# Patient Record
Sex: Male | Born: 1940 | Race: Black or African American | Hispanic: No
Health system: Southern US, Community
[De-identification: ages and names within clinical notes are randomized; demographics above are authoritative.]

## PROBLEM LIST (undated history)

## (undated) DIAGNOSIS — E785 Hyperlipidemia, unspecified: Secondary | ICD-10-CM

## (undated) DIAGNOSIS — F4310 Post-traumatic stress disorder, unspecified: Secondary | ICD-10-CM

## (undated) DIAGNOSIS — Z931 Gastrostomy status: Secondary | ICD-10-CM

## (undated) DIAGNOSIS — R2681 Unsteadiness on feet: Secondary | ICD-10-CM

## (undated) DIAGNOSIS — C61 Malignant neoplasm of prostate: Secondary | ICD-10-CM

## (undated) DIAGNOSIS — M6281 Muscle weakness (generalized): Secondary | ICD-10-CM

## (undated) DIAGNOSIS — I10 Essential (primary) hypertension: Secondary | ICD-10-CM

## (undated) DIAGNOSIS — R293 Abnormal posture: Secondary | ICD-10-CM

## (undated) DIAGNOSIS — Z9981 Dependence on supplemental oxygen: Secondary | ICD-10-CM

## (undated) DIAGNOSIS — Z66 Do not resuscitate: Secondary | ICD-10-CM

## (undated) DIAGNOSIS — G500 Trigeminal neuralgia: Secondary | ICD-10-CM

## (undated) DIAGNOSIS — F39 Unspecified mood [affective] disorder: Secondary | ICD-10-CM

## (undated) DIAGNOSIS — E119 Type 2 diabetes mellitus without complications: Secondary | ICD-10-CM

## (undated) DIAGNOSIS — M1990 Unspecified osteoarthritis, unspecified site: Secondary | ICD-10-CM

## (undated) DIAGNOSIS — K219 Gastro-esophageal reflux disease without esophagitis: Secondary | ICD-10-CM

## (undated) DIAGNOSIS — F0390 Unspecified dementia without behavioral disturbance: Secondary | ICD-10-CM

## (undated) HISTORY — PX: GASTROSTOMY: SHX151

---

## 2019-12-04 ENCOUNTER — Other Ambulatory Visit: Payer: Self-pay

## 2019-12-04 ENCOUNTER — Encounter: Payer: Self-pay | Admitting: Emergency Medicine

## 2019-12-04 ENCOUNTER — Inpatient Hospital Stay
Admission: EM | Admit: 2019-12-04 | Discharge: 2019-12-09 | DRG: 871 | Disposition: A | Discharge status: Skilled Nursing Facility | Payer: Medicare Other | Attending: Internal Medicine | Admitting: Internal Medicine

## 2019-12-04 ENCOUNTER — Emergency Department: Payer: Medicare Other

## 2019-12-04 DIAGNOSIS — F4310 Post-traumatic stress disorder, unspecified: Secondary | ICD-10-CM | POA: Diagnosis present

## 2019-12-04 DIAGNOSIS — E785 Hyperlipidemia, unspecified: Secondary | ICD-10-CM | POA: Diagnosis present

## 2019-12-04 DIAGNOSIS — R52 Pain, unspecified: Secondary | ICD-10-CM

## 2019-12-04 DIAGNOSIS — J189 Pneumonia, unspecified organism: Secondary | ICD-10-CM | POA: Diagnosis present

## 2019-12-04 DIAGNOSIS — I10 Essential (primary) hypertension: Secondary | ICD-10-CM | POA: Diagnosis present

## 2019-12-04 DIAGNOSIS — A419 Sepsis, unspecified organism: Secondary | ICD-10-CM | POA: Diagnosis not present

## 2019-12-04 DIAGNOSIS — J9601 Acute respiratory failure with hypoxia: Secondary | ICD-10-CM

## 2019-12-04 DIAGNOSIS — N390 Urinary tract infection, site not specified: Secondary | ICD-10-CM | POA: Diagnosis present

## 2019-12-04 DIAGNOSIS — Z981 Arthrodesis status: Secondary | ICD-10-CM

## 2019-12-04 DIAGNOSIS — R1310 Dysphagia, unspecified: Secondary | ICD-10-CM | POA: Diagnosis present

## 2019-12-04 DIAGNOSIS — J9600 Acute respiratory failure, unspecified whether with hypoxia or hypercapnia: Secondary | ICD-10-CM | POA: Diagnosis present

## 2019-12-04 DIAGNOSIS — G9341 Metabolic encephalopathy: Secondary | ICD-10-CM | POA: Diagnosis present

## 2019-12-04 DIAGNOSIS — R4182 Altered mental status, unspecified: Secondary | ICD-10-CM

## 2019-12-04 DIAGNOSIS — Z20822 Contact with and (suspected) exposure to covid-19: Secondary | ICD-10-CM

## 2019-12-04 DIAGNOSIS — F0390 Unspecified dementia without behavioral disturbance: Secondary | ICD-10-CM | POA: Diagnosis present

## 2019-12-04 DIAGNOSIS — Z20828 Contact with and (suspected) exposure to other viral communicable diseases: Secondary | ICD-10-CM | POA: Diagnosis present

## 2019-12-04 DIAGNOSIS — K219 Gastro-esophageal reflux disease without esophagitis: Secondary | ICD-10-CM | POA: Diagnosis present

## 2019-12-04 DIAGNOSIS — E876 Hypokalemia: Secondary | ICD-10-CM

## 2019-12-04 DIAGNOSIS — K1379 Other lesions of oral mucosa: Secondary | ICD-10-CM

## 2019-12-04 DIAGNOSIS — G9340 Encephalopathy, unspecified: Secondary | ICD-10-CM | POA: Diagnosis present

## 2019-12-04 DIAGNOSIS — Z9981 Dependence on supplemental oxygen: Secondary | ICD-10-CM

## 2019-12-04 DIAGNOSIS — Z931 Gastrostomy status: Secondary | ICD-10-CM

## 2019-12-04 DIAGNOSIS — Z66 Do not resuscitate: Secondary | ICD-10-CM

## 2019-12-04 DIAGNOSIS — J9621 Acute and chronic respiratory failure with hypoxia: Secondary | ICD-10-CM

## 2019-12-04 DIAGNOSIS — Z8546 Personal history of malignant neoplasm of prostate: Secondary | ICD-10-CM

## 2019-12-04 DIAGNOSIS — G500 Trigeminal neuralgia: Secondary | ICD-10-CM | POA: Diagnosis present

## 2019-12-04 DIAGNOSIS — Z934 Other artificial openings of gastrointestinal tract status: Secondary | ICD-10-CM

## 2019-12-04 DIAGNOSIS — R7989 Other specified abnormal findings of blood chemistry: Secondary | ICD-10-CM

## 2019-12-04 DIAGNOSIS — B961 Klebsiella pneumoniae [K. pneumoniae] as the cause of diseases classified elsewhere: Secondary | ICD-10-CM | POA: Diagnosis present

## 2019-12-04 DIAGNOSIS — E119 Type 2 diabetes mellitus without complications: Secondary | ICD-10-CM | POA: Diagnosis present

## 2019-12-04 HISTORY — DX: Unspecified osteoarthritis, unspecified site: M19.90

## 2019-12-04 HISTORY — DX: Essential (primary) hypertension: I10

## 2019-12-04 HISTORY — DX: Unsteadiness on feet: R26.81

## 2019-12-04 HISTORY — DX: Muscle weakness (generalized): M62.81

## 2019-12-04 HISTORY — DX: Abnormal posture: R29.3

## 2019-12-04 HISTORY — DX: Gastro-esophageal reflux disease without esophagitis: K21.9

## 2019-12-04 HISTORY — DX: Type 2 diabetes mellitus without complications: E11.9

## 2019-12-04 HISTORY — DX: Malignant neoplasm of prostate: C61

## 2019-12-04 HISTORY — DX: Trigeminal neuralgia: G50.0

## 2019-12-04 HISTORY — DX: Unspecified dementia, unspecified severity, without behavioral disturbance, psychotic disturbance, mood disturbance, and anxiety: F03.90

## 2019-12-04 HISTORY — DX: Dependence on supplemental oxygen: Z99.81

## 2019-12-04 HISTORY — DX: Post-traumatic stress disorder, unspecified: F43.10

## 2019-12-04 HISTORY — DX: Do not resuscitate: Z66

## 2019-12-04 HISTORY — DX: Hyperlipidemia, unspecified: E78.5

## 2019-12-04 HISTORY — DX: Gastrostomy status: Z93.1

## 2019-12-04 HISTORY — DX: Unspecified mood (affective) disorder: F39

## 2019-12-04 LAB — CBC WITH DIFFERENTIAL/PLATELET
Abs Immature Granulocytes: 0.03 10*3/uL (ref 0.00–0.07)
Basophils Absolute: 0.1 10*3/uL (ref 0.0–0.1)
Basophils Relative: 1 %
Eosinophils Absolute: 0.2 10*3/uL (ref 0.0–0.5)
Eosinophils Relative: 1 %
HCT: 36.7 % — ABNORMAL LOW (ref 39.0–52.0)
Hemoglobin: 11.4 g/dL — ABNORMAL LOW (ref 13.0–17.0)
Immature Granulocytes: 0 %
Lymphocytes Relative: 8 %
Lymphs Abs: 1 10*3/uL (ref 0.7–4.0)
MCH: 27.1 pg (ref 26.0–34.0)
MCHC: 31.1 g/dL (ref 30.0–36.0)
MCV: 87.4 fL (ref 80.0–100.0)
Monocytes Absolute: 0.9 10*3/uL (ref 0.1–1.0)
Monocytes Relative: 7 %
Neutro Abs: 10.1 10*3/uL — ABNORMAL HIGH (ref 1.7–7.7)
Neutrophils Relative %: 83 %
Platelets: 387 10*3/uL (ref 150–400)
RBC: 4.2 MIL/uL — ABNORMAL LOW (ref 4.22–5.81)
RDW: 15.3 % (ref 11.5–15.5)
WBC: 12.2 10*3/uL — ABNORMAL HIGH (ref 4.0–10.5)
nRBC: 0 % (ref 0.0–0.2)

## 2019-12-04 LAB — BLOOD GAS, ARTERIAL
Acid-Base Excess: 7.7 mmol/L — ABNORMAL HIGH (ref 0.0–2.0)
Bicarbonate: 32.7 mmol/L — ABNORMAL HIGH (ref 20.0–28.0)
FIO2: 0.28
O2 Saturation: 98.4 %
Patient temperature: 37
pCO2 arterial: 46 mmHg (ref 32.0–48.0)
pH, Arterial: 7.46 — ABNORMAL HIGH (ref 7.350–7.450)
pO2, Arterial: 106 mmHg (ref 83.0–108.0)

## 2019-12-04 NOTE — ED Triage Notes (Signed)
Pt to ED via [facility] from [facility]  for chief complaint of SHOB and decreased LOC. Per EMS pt was lethargic and drooling upon arrival. Pt was 84% on RA, 97% on NRB. Hx diabetes, CBG 143. Pt appears lethargic at this time. Responds to verbal stimuli

## 2019-12-04 NOTE — ED Provider Notes (Signed)
[facility] Emergency Department Provider Note  ____________________________________________   First MD Initiated Contact with Patient 12/04/19 2324     (approximate)  I have reviewed the triage vital signs and the nursing notes.   HISTORY  Chief Complaint Shortness of Breath  Level 5 caveat:  history/ROS limited by chronic dementia and acute respiratory illness.  HPI Chad Allen is a 78 y.o. male with medical history as listed below which I transcribed from his records from the nursing facility.  He presents by EMS for evaluation of shortness of breath and decreased level of consciousness.  Minimal details are available but apparently EMS was called out for the chief complaint as described above and they found the patient "drooling and lethargic".  He was reportedly satting 84% on room air but he does have a well-documented history of oxygen dependence at baseline on 2 L by nasal cannula.  On nonrebreather he came up to 97%.  He was still obtunded upon arrival to the ED and did not respond to verbal stimuli but responded to sternal rub by withdrawing from the painful stimulus.  However a few minutes later by the time I saw him after  he had been on 4 L of oxygen by nasal cannula and satting 100%, he was awake, somnolent but answering questions and denying pain.  He reported some shortness of breath but is unable to provide details.  He has a documented history of dementia as well as the oxygen dependence (see below for further details about his history).  Of note he is from [facility] which she has had multiple Covid patients recently but according to EMS he was tested a week ago and was negative.  He is unable to provide additional history.        Past Medical History:  Diagnosis Date  . Abnormal posture   . Dementia ([place])   . 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 DNR (do not resuscitate)   . Feeding by G-tube ([place])     . Generalized muscle weakness   . GERD (gastroesophageal reflux disease)   . Hyperlipidemia   . Hypertension   . Malignant neoplasm of prostate ([place])   . Mood disorder ([place])   . Osteoarthritis   . PTSD (post-traumatic stress disorder)   . Trigeminal neuralgia   . Type 2 diabetes mellitus without complications ([place])   . Unsteadiness on feet     Patient Active Problem List   Diagnosis Date Noted  . Acute metabolic encephalopathy [id number]  . Sepsis ([place]) 12/05/2019  . UTI (urinary tract infection) 12/05/2019  . Suspected COVID-19 virus infection 12/05/2019  . 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 Dementia without behavioral disturbance ([place]) 12/05/2019  . Acute on chronic respiratory failure with hypoxia ([place]) 12/05/2019  . Community acquired pneumonia 12/05/2019  . Essential hypertension 12/05/2019  . Acute respiratory failure ([place]) 12/05/2019    Past Surgical History:  Procedure Laterality Date  . GASTROSTOMY      Prior to Admission medications   Not on File    Allergies Patient has no allergy information on record.  History reviewed. No pertinent family history.  Social History Social History   Tobacco Use  . Smoking status: Unknown If Ever Smoked  Substance Use Topics  . Alcohol use: Not on file  . Drug use: Not on file    Review of Systems Level 5 caveat:  history/ROS limited by chronic dementia and acute respiratory  illness.  ____________________________________________   PHYSICAL EXAM:  VITAL SIGNS: ED Triage Vitals  Enc Vitals Group     BP 12/04/19 2318 131/78     Pulse Rate 12/04/19 2318 (!) 107     Resp 12/04/19 2318 17     Temp --      Temp src --      SpO2 12/04/19 2318 100 %     Weight 12/04/19 2319 81.6 kg (180 lb)     Height 12/04/19 2319 1.829 m (6')     Head Circumference --      Peak Flow --      Pain Score 12/04/19 2319 Asleep     Pain Loc --      Pain Edu? --      Excl. in [place]? --     Constitutional: Awake and alert  but somnolent. Eyes: Conjunctivae are normal.  Head: Atraumatic. Nose: No congestion/rhinnorhea. Mouth/Throat: Patient was not wearing a mask upon arrival but I placed one on him over his nasal cannula. Neck: No stridor.  No meningeal signs.   Cardiovascular: Mild tachycardia, regular rhythm. Good peripheral circulation. Grossly normal heart sounds. Respiratory: Normal respiratory effort.  No retractions.  I have put the patient back on 2 L of oxygen by nasal cannula which is his baseline. Gastrointestinal: Soft and nontender. No distention.  Musculoskeletal: No lower extremity tenderness nor edema. No gross deformities of extremities. Neurologic:  Normal speech and language, answers simple questions without difficulty.. No gross focal neurologic deficits are appreciated.  Skin:  Skin is warm, dry and intact.   ____________________________________________   LABS (all labs ordered are listed, but only abnormal results are displayed)  Labs Reviewed  CBC WITH DIFFERENTIAL/PLATELET - Abnormal; Notable for the following components:      Result Value   WBC 12.2 (*)    RBC 4.20 (*)    Hemoglobin 11.4 (*)    HCT 36.7 (*)    Neutro Abs 10.1 (*)    All other components within normal limits  COMPREHENSIVE METABOLIC PANEL - Abnormal; Notable for the following components:   Potassium 3.3 (*)    Glucose, Bld 130 (*)    Total Protein 8.5 (*)    Albumin 3.4 (*)    AST 12 (*)    Anion gap 19 (*)    All other components within normal limits  FIBRIN DERIVATIVES D-DIMER ([facility] ONLY) - Abnormal; Notable for the following components:   Fibrin derivatives D-dimer ([facility]) 1,485.13 (*)    All other components within normal limits  FIBRINOGEN - Abnormal; Notable for the following components:   Fibrinogen >750 (*)    All other components within normal limits  BRAIN NATRIURETIC PEPTIDE - Abnormal; Notable for the following components:   B Natriuretic Peptide 131.0 (*)    All other components within  normal limits  BLOOD GAS, ARTERIAL - Abnormal; Notable for the following components:   pH, Arterial 7.46 (*)    Bicarbonate 32.7 (*)    Acid-Base Excess 7.7 (*)    All other components within normal limits  URINALYSIS, ROUTINE W REFLEX MICROSCOPIC - Abnormal; Notable for the following components:   Color, Urine YELLOW (*)    APPearance CLOUDY (*)    Hgb urine dipstick SMALL (*)    Ketones, ur 5 (*)    Protein, ur 100 (*)    Leukocytes,Ua LARGE (*)    WBC, UA >50 (*)    Bacteria, UA MANY (*)    All other components within normal  limits  GLUCOSE, CAPILLARY - Abnormal; Notable for the following components:   Glucose-Capillary 101 (*)    All other components within normal limits  CULTURE, BLOOD (ROUTINE X 2)  CULTURE, BLOOD (ROUTINE X 2)  URINE CULTURE  SARS CORONAVIRUS 2 (TAT 6-24 HRS)  PROCALCITONIN  LACTATE DEHYDROGENASE  FERRITIN  TRIGLYCERIDES  LACTIC ACID, PLASMA  C-REACTIVE PROTEIN  CORTISOL-AM, BLOOD  HEMOGLOBIN A1C  POC SARS CORONAVIRUS 2 AG -  ED  TROPONIN I (HIGH SENSITIVITY)   ____________________________________________  EKG  ED ECG REPORT I, Hinda Kehr, the attending physician, personally viewed and interpreted this ECG.  Date: 12/04/2019 EKG Time: 23: 16 Rate: 109 Rhythm: Sinus tachycardia QRS Axis: normal Intervals: Prolonged QTC of 516 ms ST/T Wave abnormalities: normal Narrative Interpretation: no evidence of acute ischemia  ____________________________________________  RADIOLOGY I, Hinda Kehr, personally viewed and evaluated these images (plain radiographs) as part of my medical decision making, as well as reviewing the written report by the radiologist.  ED MD interpretation:  No obvious infection/consolidation on CXR  Official radiology report(s):  DG Chest Port 1 View  Result Date: 12/05/2019 CLINICAL DATA:  Shortness of breath. Altered level of consciousness. EXAM: PORTABLE CHEST 1 VIEW COMPARISON:  None. FINDINGS: Left lung apex  not included in the field of view. Lung volumes are low with bronchovascular crowding. Heart size grossly normal. Atelectasis with mild interstitial thickening at the bases, left greater than. No visualized pneumothorax or large pleural effusion. IMPRESSION: Low lung volumes with bronchovascular crowding. Bibasilar atelectasis with basilar interstitial thickening, chronicity uncertain. No element of basilar infection is considered, primarily on the left. Electronically Signed   By: Keith Rake M.D.   On: 12/05/2019 00:27    ____________________________________________   PROCEDURES   Procedure(s) performed (including Critical Care):  .Critical Care Performed by: Hinda Kehr, MD Authorized by: Hinda Kehr, MD   Critical care provider statement:    Critical care time (minutes):  30   Critical care time was exclusive of:  Separately billable procedures and treating other patients   Critical care was necessary to treat or prevent imminent or life-threatening deterioration of the following conditions:  Sepsis   Critical care was time spent personally by me on the following activities:  Development of treatment plan with patient or surrogate, discussions with consultants, evaluation of patient's response to treatment, examination of patient, obtaining history from patient or surrogate, ordering and performing treatments and interventions, ordering and review of laboratory studies, ordering and review of radiographic studies, pulse oximetry, re-evaluation of patient's condition and review of old charts     ____________________________________________   [place] / MDM / [place] / ED COURSE  As part of my medical decision making, I reviewed the following data within the electronic MEDICAL RECORD NUMBER Notes from prior ED visits and [place] Controlled Substance Database   Differential diagnosis includes, but is not limited to, brief hypoxic episode due to being off of his  chronic oxygen, acute infection such as healthcare associated pneumonia or COVID-19, PE, ACS, pneumothorax, CVA or intracranial hemorrhage.  The patient was initially minimally responsive but has come around and now is answering questions and speaking to me.  I am not certain of his baseline but he is oriented to self and is able to say that he feels okay except for little shortness of breath.  His vital signs are notable for some mild tachycardia and I have asked the nurses to get a rectal temperature.  He was tested for COVID-19  a week ago but there is high prevalence in that community and I think it is a likely diagnosis but it is also possible that he simply came off of his chronic oxygen and was hypoxic long enough to become altered and has improved now that he is once again on oxygen.  I have asked respiratory to get an ABG to check for hypoxia as well as for CO2 retention.  Lab work is pending including all of the standard work-up for COVID-19 and sepsis (lactic acid and procalcitonin).  I am going to give a small fluid bolus for his tachycardia.  EKG is unremarkable except for tachycardia.  Chest x-ray pending.      Clinical Course as of Dec 04 357  Tue Dec 04, 2019  2333 pCO2 arterial: 46 [CF]  Wed Dec 05, 2019  0028 pO2, Arterial: 106 [CF]  0028 Substantially elevated D-dimer which could be related to his age and chronic medical conditions, could indicate PE, or more likely is indicative of COVID-19.  We will continue to monitor.  Fibrin derivatives D-dimer [facility])(!): 1,485.13 [CF]  0029 Also suggestive of COVID-19.  Fibrinogen(!): >750 [CF]  0029 Nondiagnostic CBC with a very slight leukocytosis, stable hemoglobin.  CBC WITH DIFFERENTIAL(!) [CF]  0037 No clear evidence of acute infection although it is possible, some bronchial thickening and bibasilar changes.  DG Chest Port 1 View [CF]  [phone] Grossly positive urine, urine culture is pending, will begin treatment with ceftriaxone 1 g IV    Urinalysis, Routine w reflex microscopic(!) [CF]  0050 Anion gap(!): 19 [CF]  0107 Elevated but not strongly elevated  Procalcitonin: 0.39 [CF]  0107 Generally reassuring  Troponin I (High Sensitivity): 11 [CF]  0119 SARS Coronavirus 2 Ag: Negative [CF]  0129 Lactic Acid, Venous: 1.3 [CF]  0137 The patient meets sepsis criteria and is persistently tachycardic at greater than 100 bpm in spite of a fluid bolus and his ceftriaxone 1 g IV.  Given the strongly positive urine I have ordered code sepsis on him.  I have ordered another 1 L of fluid but I do not want to fluid overload him and he does not meet criteria for septic shock.  He he is a little bit more somnolent than he was initially but still responsive to painful stimuli and to loud noises.  I do not think he is going to be appropriate for discharge back to his facility.  However given his worsening hypoxia initially and his altered status as well as his D-dimer and his relatively decreased level of activity at baseline, I am ordering a CTA chest which will help rule out pulmonary embolism as well as interstitial lung disease, acute infiltrates, etc.  I will also consult the hospitalist for admission.   [CF]  0206 Discussed case with Dr. Damita Dunnings who will admit.   [CF]    Clinical Course User Index [CF] Hinda Kehr, MD     ____________________________________________  FINAL CLINICAL IMPRESSION(S) / ED DIAGNOSES  Final diagnoses:  Sepsis, due to unspecified organism, unspecified whether acute organ dysfunction present [facility])  Urinary tract infection without hematuria, site unspecified  Acute respiratory failure with hypoxemia ([place])  Suspected COVID-19 virus infection  Altered mental status, unspecified altered mental status type  Dementia without behavioral disturbance, unspecified dementia type ([place])  DNR (do not resuscitate)  Elevated d-dimer  Gastrostomy in place [facility])     MEDICATIONS GIVEN DURING THIS VISIT:  Medications   cefTRIAXone (ROCEPHIN) 1 g in sodium chloride 0.9 % 100  mL IVPB (0 g Intravenous Stopped 12/05/19 0134)  enoxaparin (LOVENOX) injection 40 mg (40 mg Subcutaneous Given 12/05/19 0342)  0.9 %  sodium chloride infusion ( Intravenous New Bag/Given 12/05/19 0352)  acetaminophen (TYLENOL) tablet 650 mg (has no administration in time range)    Or  acetaminophen (TYLENOL) suppository 650 mg (has no administration in time range)  insulin aspart (novoLOG) injection 0-9 Units (0 Units Subcutaneous Not Given 12/05/19 0342)  insulin aspart (novoLOG) injection 2 Units (has no administration in time range)  doxycycline (VIBRAMYCIN) 100 mg in sodium chloride 0.9 % 250 mL IVPB (100 mg Intravenous New Bag/Given 12/05/19 0347)  sodium chloride 0.9 % bolus 500 mL (0 mLs Intravenous Stopped 12/05/19 0134)  sodium chloride 0.9 % bolus 1,000 mL (0 mLs Intravenous Stopped 12/05/19 0353)  iohexol (OMNIPAQUE) 350 MG/ML injection 75 mL (75 mLs Intravenous Contrast Given 12/05/19 0251)     ED Discharge Orders    None      *Please note:  Nilan Allen was evaluated in Emergency Department on 12/05/2019 for the symptoms described in the history of present illness. He was evaluated in the context of the global COVID-19 pandemic, which necessitated consideration that the patient might be at risk for infection with the SARS-CoV-2 virus that causes COVID-19. Institutional protocols and algorithms that pertain to the evaluation of patients at risk for COVID-19 are in a state of rapid change based on information released by regulatory bodies including the CDC and federal and state organizations. These policies and algorithms were followed during the patient's care in the ED.  Some ED evaluations and interventions may be delayed as a result of limited staffing during the pandemic.*  Note:  This document was prepared using Dragon voice recognition software and may include unintentional dictation errors.   Hinda Kehr,  MD 12/05/19 [phone]

## 2019-12-05 ENCOUNTER — Emergency Department: Payer: Medicare Other

## 2019-12-05 ENCOUNTER — Inpatient Hospital Stay: Payer: Medicare Other

## 2019-12-05 ENCOUNTER — Encounter: Payer: Self-pay | Admitting: Internal Medicine

## 2019-12-05 DIAGNOSIS — J9600 Acute respiratory failure, unspecified whether with hypoxia or hypercapnia: Secondary | ICD-10-CM | POA: Diagnosis present

## 2019-12-05 DIAGNOSIS — Z931 Gastrostomy status: Secondary | ICD-10-CM

## 2019-12-05 DIAGNOSIS — G500 Trigeminal neuralgia: Secondary | ICD-10-CM | POA: Diagnosis present

## 2019-12-05 DIAGNOSIS — J9621 Acute and chronic respiratory failure with hypoxia: Secondary | ICD-10-CM

## 2019-12-05 DIAGNOSIS — Z981 Arthrodesis status: Secondary | ICD-10-CM | POA: Diagnosis not present

## 2019-12-05 DIAGNOSIS — F4310 Post-traumatic stress disorder, unspecified: Secondary | ICD-10-CM | POA: Diagnosis present

## 2019-12-05 DIAGNOSIS — F0390 Unspecified dementia without behavioral disturbance: Secondary | ICD-10-CM | POA: Diagnosis present

## 2019-12-05 DIAGNOSIS — R1310 Dysphagia, unspecified: Secondary | ICD-10-CM | POA: Diagnosis present

## 2019-12-05 DIAGNOSIS — J189 Pneumonia, unspecified organism: Secondary | ICD-10-CM | POA: Diagnosis present

## 2019-12-05 DIAGNOSIS — E119 Type 2 diabetes mellitus without complications: Secondary | ICD-10-CM | POA: Diagnosis present

## 2019-12-05 DIAGNOSIS — I10 Essential (primary) hypertension: Secondary | ICD-10-CM | POA: Diagnosis present

## 2019-12-05 DIAGNOSIS — A419 Sepsis, unspecified organism: Secondary | ICD-10-CM | POA: Diagnosis present

## 2019-12-05 DIAGNOSIS — E876 Hypokalemia: Secondary | ICD-10-CM | POA: Diagnosis not present

## 2019-12-05 DIAGNOSIS — G9341 Metabolic encephalopathy: Secondary | ICD-10-CM | POA: Diagnosis present

## 2019-12-05 DIAGNOSIS — B961 Klebsiella pneumoniae [K. pneumoniae] as the cause of diseases classified elsewhere: Secondary | ICD-10-CM | POA: Diagnosis present

## 2019-12-05 DIAGNOSIS — Z66 Do not resuscitate: Secondary | ICD-10-CM | POA: Diagnosis present

## 2019-12-05 DIAGNOSIS — K1379 Other lesions of oral mucosa: Secondary | ICD-10-CM | POA: Diagnosis not present

## 2019-12-05 DIAGNOSIS — Z8546 Personal history of malignant neoplasm of prostate: Secondary | ICD-10-CM | POA: Diagnosis not present

## 2019-12-05 DIAGNOSIS — G9340 Encephalopathy, unspecified: Secondary | ICD-10-CM | POA: Diagnosis present

## 2019-12-05 DIAGNOSIS — N390 Urinary tract infection, site not specified: Secondary | ICD-10-CM

## 2019-12-05 DIAGNOSIS — N3000 Acute cystitis without hematuria: Secondary | ICD-10-CM

## 2019-12-05 DIAGNOSIS — K219 Gastro-esophageal reflux disease without esophagitis: Secondary | ICD-10-CM | POA: Diagnosis present

## 2019-12-05 DIAGNOSIS — Z934 Other artificial openings of gastrointestinal tract status: Secondary | ICD-10-CM | POA: Diagnosis not present

## 2019-12-05 DIAGNOSIS — E785 Hyperlipidemia, unspecified: Secondary | ICD-10-CM | POA: Diagnosis present

## 2019-12-05 DIAGNOSIS — Z20828 Contact with and (suspected) exposure to other viral communicable diseases: Secondary | ICD-10-CM | POA: Diagnosis present

## 2019-12-05 DIAGNOSIS — Z20822 Contact with and (suspected) exposure to covid-19: Secondary | ICD-10-CM

## 2019-12-05 DIAGNOSIS — Z9981 Dependence on supplemental oxygen: Secondary | ICD-10-CM | POA: Diagnosis not present

## 2019-12-05 LAB — URINALYSIS, ROUTINE W REFLEX MICROSCOPIC
Bilirubin Urine: NEGATIVE
Glucose, UA: NEGATIVE mg/dL
Ketones, ur: 5 mg/dL — AB
Nitrite: NEGATIVE
Protein, ur: 100 mg/dL — AB
Specific Gravity, Urine: 1.014 (ref 1.005–1.030)
WBC, UA: >50 WBC/hpf — ABNORMAL HIGH (ref 0–5)
pH: 5 (ref 5.0–8.0)

## 2019-12-05 LAB — COMPREHENSIVE METABOLIC PANEL
ALT: 8 U/L (ref 0–44)
AST: 12 U/L — ABNORMAL LOW (ref 15–41)
Albumin: 3.4 g/dL — ABNORMAL LOW (ref 3.5–5.0)
Alkaline Phosphatase: 113 U/L (ref 38–126)
Anion gap: 19 — ABNORMAL HIGH (ref 5–15)
BUN: 18 mg/dL (ref 8–23)
CO2: 25 mmol/L (ref 22–32)
Calcium: 8.9 mg/dL (ref 8.9–10.3)
Chloride: 98 mmol/L (ref 98–111)
Creatinine, Ser: 1.12 mg/dL (ref 0.61–1.24)
GFR calc Af Amer: >60 mL/min (ref >60)
GFR calc non Af Amer: >60 mL/min (ref >60)
Glucose, Bld: 130 mg/dL — ABNORMAL HIGH (ref 70–99)
Potassium: 3.3 mmol/L — ABNORMAL LOW (ref 3.5–5.1)
Sodium: 142 mmol/L (ref 135–145)
Total Bilirubin: 0.9 mg/dL (ref 0.3–1.2)
Total Protein: 8.5 g/dL — ABNORMAL HIGH (ref 6.5–8.1)

## 2019-12-05 LAB — LACTIC ACID, PLASMA: Lactic Acid, Venous: 1.3 mmol/L (ref 0.5–1.9)

## 2019-12-05 LAB — BRAIN NATRIURETIC PEPTIDE: B Natriuretic Peptide: 131 pg/mL — ABNORMAL HIGH (ref 0.0–100.0)

## 2019-12-05 LAB — FIBRINOGEN: Fibrinogen: >750 mg/dL — ABNORMAL HIGH (ref 210–475)

## 2019-12-05 LAB — TRIGLYCERIDES: Triglycerides: 120 mg/dL (ref <150)

## 2019-12-05 LAB — SARS CORONAVIRUS 2 (TAT 6-24 HRS): SARS Coronavirus 2: NEGATIVE

## 2019-12-05 LAB — GLUCOSE, CAPILLARY
Glucose-Capillary: 101 mg/dL — ABNORMAL HIGH (ref 70–99)
Glucose-Capillary: 90 mg/dL (ref 70–99)
Glucose-Capillary: 82 mg/dL (ref 70–99)
Glucose-Capillary: 76 mg/dL (ref 70–99)

## 2019-12-05 LAB — FERRITIN: Ferritin: 82 ng/mL (ref 24–336)

## 2019-12-05 LAB — TROPONIN I (HIGH SENSITIVITY): Troponin I (High Sensitivity): 11 ng/L (ref <18)

## 2019-12-05 LAB — C-REACTIVE PROTEIN: CRP: 17.5 mg/dL — ABNORMAL HIGH (ref <1.0)

## 2019-12-05 LAB — HEMOGLOBIN A1C
Hgb A1c MFr Bld: 4.9 % (ref 4.8–5.6)
Mean Plasma Glucose: 93.93 mg/dL

## 2019-12-05 LAB — POC SARS CORONAVIRUS 2 AG -  ED: SARS Coronavirus 2 Ag: NEGATIVE

## 2019-12-05 LAB — LACTATE DEHYDROGENASE: LDH: 151 U/L (ref 98–192)

## 2019-12-05 LAB — CORTISOL-AM, BLOOD: Cortisol - AM: 19.3 ug/dL (ref 6.7–22.6)

## 2019-12-05 LAB — FIBRIN DERIVATIVES D-DIMER (ARMC ONLY): Fibrin derivatives D-dimer (ARMC): 1485.13 ng/mL (FEU) — ABNORMAL HIGH (ref 0.00–499.00)

## 2019-12-05 LAB — PROCALCITONIN: Procalcitonin: 0.39 ng/mL

## 2019-12-05 MED ORDER — SODIUM CHLORIDE 0.9 % IV SOLN: INTRAVENOUS | Status: DC

## 2019-12-05 MED ORDER — SODIUM CHLORIDE 0.9 % IV SOLN
1.0000 g | INTRAVENOUS | Status: DC
  Administered 2019-12-05 (×2): 1 g via INTRAVENOUS
  Filled 2019-12-05 (×3): qty 10

## 2019-12-05 MED ORDER — IOHEXOL 350 MG/ML SOLN
75.0000 mL | Freq: Once | INTRAVENOUS | Status: AC | PRN
  Administered 2019-12-05: 03:00:00 75 mL via INTRAVENOUS

## 2019-12-05 MED ORDER — TRAZODONE HCL 50 MG PO TABS
50.0000 mg | ORAL_TABLET | Freq: Once | ORAL | Status: AC
  Administered 2019-12-05: 50 mg via ORAL
  Filled 2019-12-05 (×2): qty 1

## 2019-12-05 MED ORDER — ENOXAPARIN SODIUM 40 MG/0.4ML ~~LOC~~ SOLN
40.0000 mg | SUBCUTANEOUS | Status: DC
  Administered 2019-12-05 – 2019-12-09 (×5): 40 mg via SUBCUTANEOUS
  Filled 2019-12-05 (×5): qty 0.4

## 2019-12-05 MED ORDER — SODIUM CHLORIDE 0.9 % IV BOLUS (SEPSIS)
1000.0000 mL | Freq: Once | INTRAVENOUS | Status: AC
  Administered 2019-12-05: 1000 mL via INTRAVENOUS

## 2019-12-05 MED ORDER — SODIUM CHLORIDE 0.9 % IV SOLN: 1.0000 g | INTRAVENOUS | Status: DC

## 2019-12-05 MED ORDER — SODIUM CHLORIDE 0.9 % IV SOLN
100.0000 mg | Freq: Two times a day (BID) | INTRAVENOUS | Status: DC
  Administered 2019-12-05 – 2019-12-06 (×3): 100 mg via INTRAVENOUS
  Filled 2019-12-05 (×4): qty 100

## 2019-12-05 MED ORDER — INSULIN ASPART 100 UNIT/ML ~~LOC~~ SOLN
0.0000 [IU] | SUBCUTANEOUS | Status: DC
  Administered 2019-12-07 – 2019-12-08 (×2): 1 [IU] via SUBCUTANEOUS
  Administered 2019-12-08: 18:00:00 2 [IU] via SUBCUTANEOUS
  Administered 2019-12-09 (×2): 1 [IU] via SUBCUTANEOUS
  Filled 2019-12-05 (×5): qty 1

## 2019-12-05 MED ORDER — SODIUM CHLORIDE 0.9 % IV SOLN: 500.0000 mg | INTRAVENOUS | Status: DC

## 2019-12-05 MED ORDER — ACETAMINOPHEN 325 MG PO TABS
650.0000 mg | ORAL_TABLET | Freq: Four times a day (QID) | ORAL | Status: DC | PRN
  Administered 2019-12-05: 650 mg via ORAL
  Filled 2019-12-05: qty 2

## 2019-12-05 MED ORDER — ACETAMINOPHEN 650 MG RE SUPP: 650.0000 mg | Freq: Four times a day (QID) | RECTAL | Status: DC | PRN

## 2019-12-05 MED ORDER — INSULIN ASPART 100 UNIT/ML ~~LOC~~ SOLN
2.0000 [IU] | SUBCUTANEOUS | Status: DC
  Administered 2019-12-08 – 2019-12-09 (×6): 2 [IU] via SUBCUTANEOUS
  Filled 2019-12-05 (×7): qty 1

## 2019-12-05 MED ORDER — SODIUM CHLORIDE 0.9 % IV BOLUS
500.0000 mL | Freq: Once | INTRAVENOUS | Status: AC
  Administered 2019-12-05: 01:00:00 500 mL via INTRAVENOUS

## 2019-12-05 NOTE — ED Notes (Signed)
Patient transported to CT 

## 2019-12-05 NOTE — Progress Notes (Signed)
CODE SEPSIS - PHARMACY COMMUNICATION  **Broad Spectrum Antibiotics should be administered within 1 hour of Sepsis diagnosis**  Time Code Sepsis Called/Page Received: 0129  Antibiotics Ordered: doxycycline, ceftriaxone  Time of 1st antibiotic administration: 0102  Additional action taken by pharmacy:   If necessary, Name of Provider/Nurse Contacted:     Tobie Lords ,PharmD Clinical Pharmacist  12/05/2019  2:52 AM

## 2019-12-05 NOTE — ED Notes (Signed)
Recollect lactic sent to lab

## 2019-12-05 NOTE — ED Notes (Signed)
Lab notified of sending white label with covid swab due to [facility] issues

## 2019-12-05 NOTE — H&P (Signed)
History and Physical    Chad Allen ENI:[id number] DOB: 02-21-1941 DOA: 12/04/2019  PCP: Alvester Morin, MD   Patient coming from: [facility]   Chief Complaint: decreased responsiveness, hypoxia  HPI: Chad Allen is a 78 y.o. male nursing home resident with a history of dementia, gastrostomy tube, on chronic home O2, non-insulin-dependent type 2 diabetes and hypertensionWho was sent to the emergency room was sent to the emergency room after he was found with decreased responsiveness, drooling on himself and appearing to be short of breath.  Arrival of EMS they recorded an O2 sat of 84% on room air that improved to 97% on nonrebreather.  By arrival in the emergency room O2 sats in the high 90s via nonrebreather.  He was afebrile but pulse was 114 and respirations 25.  After short time in the emergency room he became more responsive and was transitioned to O2 by nasal cannula at 4 L.  Most of the history is taken from the ER provider due to altered mental status.  Was transitioned to O2 at 4 L.  ABG showed a pH of 7.46, PCO2 of 46 and PO2 of 106.His blood work was significant for white cell count of 12,000.  Very minor electrolyte abnormalities with a potassium of 3.3.  Serum creatinine 1.12.  He also had elevated inflammatory markers including ferritin 82 triglycerides 120 LDH 151 and elevated D-dimer.  Procalcitonin was up at 0.37.  His urinalysis was strongly consistent with UTI.  Patient was started on sepsis protocol, receiving 1500 mL of normal saline in the ER, and hospitalist consulted for admission.  CTA chest from the emergency room to evaluate for acute PE, currently pending.  Rapid Covid test was negative.  Review of Systems: Unable to obtain due to altered mental status  Past Medical History:  Diagnosis Date  . Abnormal posture   . Dementia ([place])   . Dependence on continuous supplemental oxygen    2L O2 by [place]  . DNR (do not resuscitate)   . Feeding by  G-tube ([place])   . Generalized muscle weakness   . GERD (gastroesophageal reflux disease)   . Hyperlipidemia   . Hypertension   . Malignant neoplasm of prostate ([place])   . Mood disorder ([place])   . Osteoarthritis   . PTSD (post-traumatic stress disorder)   . Trigeminal neuralgia   . Type 2 diabetes mellitus without complications ([place])   . Unsteadiness on feet     Past Surgical History:  Procedure Laterality Date  . GASTROSTOMY       has no history on file for tobacco, alcohol, and drug.  Not on File  History reviewed. No pertinent family history.   Prior to Admission medications   Not on File    Physical Exam: Vitals:   12/05/19 0014 12/05/19 0041 12/05/19 0100 12/05/19 0130  BP:  130/72 136/76 112/68  Pulse: (!) 105 (!) 102 100 100  Resp: 20 (!) '24 18 18  '$ Temp:      TempSrc:      SpO2: 98% 98% 98% 98%  Weight:      Height:        Constitutional: NAD, calm, comfortable Vitals:   12/05/19 0014 12/05/19 0041 12/05/19 0100 12/05/19 0130  BP:  130/72 136/76 112/68  Pulse: (!) 105 (!) 102 100 100  Resp: 20 (!) '24 18 18  '$ Temp:      TempSrc:      SpO2: 98% 98% 98% 98%  Weight:  Height:       Constitutional: Patient drowsy easily arousable Eyes: PERRL, lids and conjunctivae normal ENMT: Mucous membranes are moist.  Neck: normal, supple, no masses, no thyromegaly Respiratory: clear to auscultation bilaterally, no wheezing, no crackles. Normal respiratory effort. No accessory muscle use.  Cardiovascular: Regular rate and rhythm, no murmurs / rubs / gallops. Abdomen: no tenderness, no masses palpated.  Musculoskeletal: No gross deformities. Skin: no rashes, lesions, ulcers.  Neurologic: No gross focal neurologic deficits       Labs on Admission: I have personally reviewed following labs and imaging studies  CBC: Recent Labs  Lab 12/04/19 2342  WBC 12.2*  NEUTROABS 10.1*  HGB 11.4*  HCT 36.7*  MCV 87.4  PLT 662   Basic Metabolic Panel: Recent Labs    Lab 12/04/19 2342  NA 142  K 3.3*  CL 98  CO2 25  GLUCOSE 130*  BUN 18  CREATININE 1.12  CALCIUM 8.9   GFR: Estimated Creatinine Clearance: 61.6 mL/min (by C-G formula based on SCr of 1.12 mg/dL). Liver Function Tests: Recent Labs  Lab 12/04/19 2342  AST 12*  ALT 8  ALKPHOS 113  BILITOT 0.9  PROT 8.5*  ALBUMIN 3.4*   No results for input(s): LIPASE, AMYLASE in the last 168 hours. No results for input(s): AMMONIA in the last 168 hours. Coagulation Profile: No results for input(s): INR, PROTIME in the last 168 hours. Cardiac Enzymes: No results for input(s): CKTOTAL, CKMB, CKMBINDEX, TROPONINI in the last 168 hours. BNP (last 3 results) No results for input(s): PROBNP in the last 8760 hours. HbA1C: No results for input(s): HGBA1C in the last 72 hours. CBG: No results for input(s): GLUCAP in the last 168 hours. Lipid Profile: Recent Labs    12/04/19 2336  TRIG 120   Thyroid Function Tests: No results for input(s): TSH, T4TOTAL, FREET4, T3FREE, THYROIDAB in the last 72 hours. Anemia Panel: Recent Labs    12/04/19 2342  FERRITIN 82   Urine analysis:    Component Value Date/Time   COLORURINE YELLOW (A) 12/04/2019 2358   APPEARANCEUR CLOUDY (A) 12/04/2019 2358   LABSPEC 1.014 12/04/2019 2358   PHURINE 5.0 12/04/2019 2358   GLUCOSEU NEGATIVE 12/04/2019 2358   HGBUR SMALL (A) 12/04/2019 2358   BILIRUBINUR NEGATIVE 12/04/2019 2358   KETONESUR 5 (A) 12/04/2019 2358   PROTEINUR 100 (A) 12/04/2019 2358   NITRITE NEGATIVE 12/04/2019 2358   LEUKOCYTESUR LARGE (A) 12/04/2019 2358    Radiological Exams on Admission: DG Chest Port 1 View  Result Date: 12/05/2019 CLINICAL DATA:  Shortness of breath. Altered level of consciousness. EXAM: PORTABLE CHEST 1 VIEW COMPARISON:  None. FINDINGS: Left lung apex not included in the field of view. Lung volumes are low with bronchovascular crowding. Heart size grossly normal. Atelectasis with mild interstitial thickening at  the bases, left greater than. No visualized pneumothorax or large pleural effusion. IMPRESSION: Low lung volumes with bronchovascular crowding. Bibasilar atelectasis with basilar interstitial thickening, chronicity uncertain. No element of basilar infection is considered, primarily on the left. Electronically Signed   By: Keith Rake M.D.   On: 12/05/2019 00:27    EKG: Independently reviewed.   Assessment/Plan    Acute metabolic encephalopathy --- Suspect multifactorial and secondary to, acute hypoxia, UTI, sepsis --- Patient returned to near baseline mentation the correction of hypoxia in the emergency room  --Neurologic checks, fall precautions, aspiration precautions --- Hold tube feedings till fully awake    Sepsis ([place]) secondary to urinary tract infection --- IV  Rocephin for 5 to 7 days --- IV fluids --- Patient met soft sepsis criteria    Suspected COVID-19 virus infection --- Patient was hypoxic on arrival.  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[place]) --- Nutritionist consult resumption of tube feeds --Nursing home contacted: This patient feeds by mouth and they only flush the G-tube ---speech therapy evaluation    Dementia without behavioral disturbance (HCC) --- No acute issues    Acute on chronic respiratory failure with hypoxia (HCC) --- The nursing home patient was found without oxygen so this might have contributed but there is some suspicion for pneumonia --- Patient is currently saturating well on O2 at 2 L which is his home flow rate    Community acquired pneumonia --- Rocephin and doxycycline for 5 days --- Follow blood cultures, follow-up Covid PCR   essential hypertension --- Resume meds     DVT prophylaxis: lovenox Code Status:DNR   Athena Masse MD [facility]   If 7PM-7AM, please contact night-coverage [email or web address] Password [facility]  12/05/2019, 2:33 AM

## 2019-12-05 NOTE — ED Notes (Signed)
Patient was fed lunch. 2 oz. Of puree chicken 3 oz. Of Sherbet ice cream 2 oz. Unsweet tea  He was done eating,pt is sitting up in bed until food has settled. Reported to [employer].

## 2019-12-05 NOTE — ED Notes (Signed)
Pt's arms are bent/contracted, pt has difficulty straightening arms; this is a change from earlier when pt arrived. Pt is alert but will not answer RN's questions, only nods or shakes head.  Pt appears in NAD at this time.

## 2019-12-05 NOTE — ED Notes (Addendum)
Pt back to treatment room from CT and admitting MD at bedside.

## 2019-12-05 NOTE — ED Notes (Signed)
Pt heard yelling, upon this RN entering the room. Pt began having a conversation with this RN and asking about where he was and why he was here. Pt updating on plan of care.  Pt speech clear

## 2019-12-05 NOTE — Progress Notes (Signed)
  PROGRESS NOTE    Chad Allen  [id number] DOB: 1941/06/23 DOA: 12/04/2019  PCP: Alvester Morin, MD    LOS - 0    Patient admitted early this morning with acute encephalopathy, likely due to UTI.    Full H&P by Dr. Damita Dunnings reviewed in detail.  I agree with the assessment and plan as outlined therein.  In addition:  - SLP evaluated patient - started on dysphagia 1 (pureed) diet as patient does not have his dentures here - COVID-19 NEGATIVE, removed PUI status - follow up urine culture - continue Rocephin empirically pending culture results   No Charge   Ezekiel Slocumb, DO [facility] Pager: [phone]  If 7PM-7AM, please contact night-coverage [email or web address] Password [email or web address] 12/05/2019, 12:21 PM

## 2019-12-05 NOTE — ED Notes (Signed)
Spoke with Lorriane Shire with [facility]. She stated that pt has not been using g tube for 3 months, they are in the process of getting it removed and it is only flushed 4x a day. Also stated that the pt eats and drinks PO.  [facility] updated that pt is getting admitted. Stated that they updated family regarding pt being brought to the ED.

## 2019-12-05 NOTE — ED Notes (Signed)
Daughter called for update, states that she is out of the country, but would like her brother called by MD for a more full update.

## 2019-12-05 NOTE — Evaluation (Addendum)
Clinical/Bedside Swallow Evaluation Patient Details  Name: Chad Allen MRN: [id number] Date of Birth: 24-Jan-1941  Today's Date: 12/05/2019 Time: SLP Start Time (ACUTE ONLY): 1215 SLP Stop Time (ACUTE ONLY): 1315 SLP Time Calculation (min) (ACUTE ONLY): 60 min  Past Medical History:  Past Medical History:  Diagnosis Date  . Abnormal posture   . Dementia ([place])   . 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 DNR (do not resuscitate)   . Feeding by G-tube ([place])   . Generalized muscle weakness   . GERD (gastroesophageal reflux disease)   . Hyperlipidemia   . Hypertension   . Malignant neoplasm of prostate ([place])   . Mood disorder ([place])   . Osteoarthritis   . PTSD (post-traumatic stress disorder)   . Trigeminal neuralgia   . Type 2 diabetes mellitus without complications ([place])   . Unsteadiness on feet    Past Surgical History:  Past Surgical History:  Procedure Laterality Date  . GASTROSTOMY     HPI:  Pt is a 78 y.o. male nursing home resident with a history of Dementia, gastrostomy tube d/t Trigeminal Neuralgia - now resolved and pt has been on an oral diet for months attempting to see IR/MD to have the PEG removed), on chronic home O2, non-insulin-dependent type 2 diabetes and hypertension, mood dis., PTSD, and other medical issues who was sent to the emergency room after he was found with decreased responsiveness, O2 sats, and drooling on himself and appearing to be short of breath.  Arrival of EMS they recorded an O2 sat of 84% on room air that improved to 97% on nonrebreather.  By arrival in the emergency room O2 sats in the high 90s via nonrebreather.  He was afebrile but pulse was 114 and respirations 25.  After short time in the emergency room he became more responsive and was transitioned to O2 by nasal cannula at 4 L.  Pt is verbally responsive but confused.; follows basic commands.  Pt is admitted for suspected UTI and sepsis.  CXR/CT: "Low lung  volumes with bronchovascular crowding. Bibasilar atelectasis, possible edema".    Assessment / Plan / Recommendation Clinical Impression  Pt appears to exhibit an adquate oropharyngeal phase swallow function w/ No clinical s/s of aspiration during po trials at assessment today. Pt is Confused and has a baseline of Dementia which impacted his attention to task -- at times, he was impulsive when drinking and needed verbal cues to Slow Down. Verbal cues were given to redirect attention to tasks as well; pt was easily redirected. During trials of thin liquids and purees, No decline in O2 stats or RR noted during/post trials. Oral phase WFL for bolus management of these consistencies - timely A-P transfer and oral clearing noted. OM exam appeared [facility] for lingual and labial movements, strength. Pt required some feeding assistance but was able to Hold Cup to drink. Pt confirmed the Trigeminal Neurolgia has resolved, and he is Not bothered by any oral phase pain, mastication pain. Recommend a Dysphagia level 1 (PUREE) diet  w/ Thin liquids -- BY CUP ONLY, NO STRAWS; but w/ Edentulous status and Cognitive decline/confusion, no solid foods/diet are recommended -- Dentures are not present and he says he "wears them" at the NH. He can remain on this diet and discharge on this diet back to his nrusing facility. They can upgrade his consistency back to what he was doing w/ them when he's wearing his Dentures. Recommend aspiration precautions, and Pills  in puree (Crushed) d/t the Dementia. NSG staff to monitor pt at meals; assistance w/ feeding. No further skilled ST services indicated at this time. MD to reconsult if any decline in swallow function is noted during admission.  SLP Visit Diagnosis: Dysphagia, oral phase (R13.11)(pt is Edentulous - may wear Dentures at [facility])    Aspiration Risk  Mild aspiration risk;Risk for inadequate nutrition/hydration(reduced following general aspiration precautions)    Diet  Recommendation  Puree diet w/ thin liquids (pt is Edentulous - when wearing his Dentures, he can upgrade in his diet again); general aspiration precautions -- NO STRAWS!  Assistance at meals d/t Confusion  Medication Administration: Crushed with puree(vs whole in puree d/t Confusion)    Other  Recommendations Recommended Consults: (Dietician f/u) Oral Care Recommendations: Oral care BID;Staff/trained caregiver to provide oral care;Oral care before and after PO Other Recommendations: (n/a)   Follow up Recommendations None      Frequency and Duration (n/a)  (n/a)       Prognosis Prognosis for Safe Diet Advancement: Fair(-Good) Barriers to Reach Goals: Cognitive deficits;Time post onset;Severity of deficits      Swallow Study   General Date of Onset: 12/04/19 HPI: Pt is a 78 y.o. male nursing home resident with a history of Dementia, gastrostomy tube d/t Trigeminal Neuralgia - now resolved and pt has been on an oral diet for months attempting to see IR/MD to have the PEG removed), on chronic home O2, non-insulin-dependent type 2 diabetes and hypertension, mood dis., PTSD, and other medical issues who was sent to the emergency room after he was found with decreased responsiveness, O2 sats, and drooling on himself and appearing to be short of breath.  Arrival of EMS they recorded an O2 sat of 84% on room air that improved to 97% on nonrebreather.  By arrival in the emergency room O2 sats in the high 90s via nonrebreather.  He was afebrile but pulse was 114 and respirations 25.  After short time in the emergency room he became more responsive and was transitioned to O2 by nasal cannula at 4 L.  Pt is verbally responsive but confused.; follows basic commands.  Pt is admitted for suspected UTI and sepsis.  CXR/CT: "Low lung volumes with bronchovascular crowding. Bibasilar atelectasis, possible edema".  Type of Study: Bedside Swallow Evaluation Previous Swallow Assessment: trigeminal neuralgia Diet  Prior to this Study: NPO(regular diet at NH per report -- not using PEG) Temperature Spikes Noted: No(wbc elevated at admit) Respiratory Status: Nasal cannula(2-4L) History of Recent Intubation: No Behavior/Cognition: Alert;Cooperative;Pleasant mood;Confused;Distractible;Requires cueing(often yelling out) Oral Cavity Assessment: Within Functional Limits Oral Care Completed by SLP: Yes Oral Cavity - Dentition: Edentulous Vision: Functional for self-feeding Self-Feeding Abilities: Able to feed self;Needs assist;Needs set up;Total assist(d/t Cognitive decline) Patient Positioning: Upright in bed(needed positioning) Baseline Vocal Quality: Normal Volitional Cough: Strong Volitional Swallow: Able to elicit    Oral/Motor/Sensory Function Overall Oral Motor/Sensory Function: Within functional limits   Ice Chips Ice chips: Within functional limits Presentation: Spoon(fed; 3 trials)   Thin Liquid Thin Liquid: Within functional limits Presentation: Cup;Self Fed(~4-5 ozs total) Other Comments: somewhat impulsive and needed cues to slow down    Nectar Thick Nectar Thick Liquid: Not tested   Honey Thick Honey Thick Liquid: Not tested   Puree Puree: Within functional limits Presentation: Spoon(fed; 10+ trials)   Solid     Solid: Not tested Other Comments: Edentulous status here at admit       Orinda Kenner, MS, CCC-SLP [place] 12/05/2019,1:46 PM

## 2019-12-05 NOTE — ED Notes (Signed)
Pt taken off NRB with MD at the bedside. Pt more alert at this time and had an oxygen saturation of 100% on NRB. Pt placed on 4L [place]. Oxygen saturation remained stable and pt was then lowered back to baseline oxygen at 2L via [place]. Pt sitting up in bed, alert and with an oxygen saturation of 98%.

## 2019-12-05 NOTE — ED Notes (Signed)
Patient calling out at times.  Wants his door open, but I explained we cannot leave it open right now.  Changed wet diaper.  Turned on TV to see if that distracts him.

## 2019-12-05 NOTE — ED Notes (Signed)
Ok for pt to have ice chips per Dr. Damita Dunnings .

## 2019-12-05 NOTE — ED Notes (Signed)
Wet diaper with small amount of stool changed.  Has healed areas on bottom.  Turned to left for now.

## 2019-12-06 ENCOUNTER — Other Ambulatory Visit: Payer: Self-pay

## 2019-12-06 DIAGNOSIS — K1379 Other lesions of oral mucosa: Secondary | ICD-10-CM

## 2019-12-06 DIAGNOSIS — F0390 Unspecified dementia without behavioral disturbance: Secondary | ICD-10-CM

## 2019-12-06 DIAGNOSIS — I10 Essential (primary) hypertension: Secondary | ICD-10-CM

## 2019-12-06 DIAGNOSIS — E876 Hypokalemia: Secondary | ICD-10-CM

## 2019-12-06 LAB — CBC WITH DIFFERENTIAL/PLATELET
Abs Immature Granulocytes: 0.03 10*3/uL (ref 0.00–0.07)
Basophils Absolute: 0.1 10*3/uL (ref 0.0–0.1)
Basophils Relative: 1 %
Eosinophils Absolute: 0.2 10*3/uL (ref 0.0–0.5)
Eosinophils Relative: 3 %
HCT: 28.6 % — ABNORMAL LOW (ref 39.0–52.0)
Hemoglobin: 9.3 g/dL — ABNORMAL LOW (ref 13.0–17.0)
Immature Granulocytes: 0 %
Lymphocytes Relative: 15 %
Lymphs Abs: 1.2 10*3/uL (ref 0.7–4.0)
MCH: 27.4 pg (ref 26.0–34.0)
MCHC: 32.5 g/dL (ref 30.0–36.0)
MCV: 84.4 fL (ref 80.0–100.0)
Monocytes Absolute: 0.7 10*3/uL (ref 0.1–1.0)
Monocytes Relative: 8 %
Neutro Abs: 5.6 10*3/uL (ref 1.7–7.7)
Neutrophils Relative %: 73 %
Platelets: 287 10*3/uL (ref 150–400)
RBC: 3.39 MIL/uL — ABNORMAL LOW (ref 4.22–5.81)
RDW: 15.6 % — ABNORMAL HIGH (ref 11.5–15.5)
WBC: 7.8 10*3/uL (ref 4.0–10.5)
nRBC: 0 % (ref 0.0–0.2)

## 2019-12-06 LAB — GLUCOSE, CAPILLARY
Glucose-Capillary: 104 mg/dL — ABNORMAL HIGH (ref 70–99)
Glucose-Capillary: 105 mg/dL — ABNORMAL HIGH (ref 70–99)
Glucose-Capillary: 109 mg/dL — ABNORMAL HIGH (ref 70–99)
Glucose-Capillary: 111 mg/dL — ABNORMAL HIGH (ref 70–99)
Glucose-Capillary: 112 mg/dL — ABNORMAL HIGH (ref 70–99)
Glucose-Capillary: 83 mg/dL (ref 70–99)
Glucose-Capillary: 96 mg/dL (ref 70–99)

## 2019-12-06 LAB — BASIC METABOLIC PANEL
Anion gap: 10 (ref 5–15)
BUN: 14 mg/dL (ref 8–23)
CO2: 27 mmol/L (ref 22–32)
Calcium: 8.5 mg/dL — ABNORMAL LOW (ref 8.9–10.3)
Chloride: 108 mmol/L (ref 98–111)
Creatinine, Ser: 0.74 mg/dL (ref 0.61–1.24)
GFR calc Af Amer: >60 mL/min (ref >60)
GFR calc non Af Amer: >60 mL/min (ref >60)
Glucose, Bld: 99 mg/dL (ref 70–99)
Potassium: 3.2 mmol/L — ABNORMAL LOW (ref 3.5–5.1)
Sodium: 145 mmol/L (ref 135–145)

## 2019-12-06 LAB — URINE CULTURE
Culture: >=100000 — AB
Special Requests: NORMAL

## 2019-12-06 LAB — MRSA PCR SCREENING: MRSA by PCR: NEGATIVE

## 2019-12-06 LAB — MAGNESIUM: Magnesium: 1.6 mg/dL — ABNORMAL LOW (ref 1.7–2.4)

## 2019-12-06 MED ORDER — ATORVASTATIN CALCIUM 20 MG PO TABS
20.0000 mg | ORAL_TABLET | Freq: Every day | ORAL | Status: DC
  Administered 2019-12-07 – 2019-12-08 (×2): 20 mg via ORAL
  Filled 2019-12-06 (×2): qty 1

## 2019-12-06 MED ORDER — LOSARTAN POTASSIUM 25 MG PO TABS
25.0000 mg | ORAL_TABLET | Freq: Every day | ORAL | Status: DC
  Administered 2019-12-07 – 2019-12-09 (×3): 25 mg via ORAL
  Filled 2019-12-06 (×3): qty 1

## 2019-12-06 MED ORDER — LIDOCAINE VISCOUS HCL 2 % MT SOLN
15.0000 mL | OROMUCOSAL | Status: DC | PRN
  Administered 2019-12-07: 15 mL via OROMUCOSAL
  Filled 2019-12-06 (×5): qty 15

## 2019-12-06 MED ORDER — CARBAMAZEPINE 200 MG PO TABS
200.0000 mg | ORAL_TABLET | Freq: Four times a day (QID) | ORAL | Status: DC
  Administered 2019-12-07 – 2019-12-09 (×11): 200 mg via ORAL
  Filled 2019-12-06 (×17): qty 1

## 2019-12-06 MED ORDER — QUETIAPINE FUMARATE 25 MG PO TABS
25.0000 mg | ORAL_TABLET | Freq: Every day | ORAL | Status: DC
  Administered 2019-12-07 – 2019-12-08 (×2): 25 mg via ORAL
  Filled 2019-12-06 (×2): qty 1

## 2019-12-06 MED ORDER — CEFAZOLIN SODIUM-DEXTROSE 1-4 GM/50ML-% IV SOLN
1.0000 g | Freq: Three times a day (TID) | INTRAVENOUS | Status: DC
  Administered 2019-12-06 – 2019-12-09 (×9): 1 g via INTRAVENOUS
  Filled 2019-12-06 (×12): qty 50

## 2019-12-06 MED ORDER — GABAPENTIN 250 MG/5ML PO SOLN
200.0000 mg | Freq: Three times a day (TID) | ORAL | Status: DC
  Administered 2019-12-07 – 2019-12-09 (×7): 200 mg via ORAL
  Filled 2019-12-06 (×12): qty 4

## 2019-12-06 MED ORDER — AMLODIPINE BESYLATE 10 MG PO TABS
10.0000 mg | ORAL_TABLET | Freq: Every day | ORAL | Status: DC
  Administered 2019-12-07 – 2019-12-09 (×3): 10 mg via ORAL
  Filled 2019-12-06 (×3): qty 1

## 2019-12-06 MED ORDER — MAGNESIUM SULFATE 2 GM/50ML IV SOLN
2.0000 g | Freq: Once | INTRAVENOUS | Status: AC
  Administered 2019-12-06: 2 g via INTRAVENOUS
  Filled 2019-12-06: qty 50

## 2019-12-06 MED ORDER — HYDRALAZINE HCL 20 MG/ML IJ SOLN
10.0000 mg | Freq: Four times a day (QID) | INTRAMUSCULAR | Status: DC | PRN
  Administered 2019-12-06 – 2019-12-07 (×2): 10 mg via INTRAVENOUS
  Filled 2019-12-06 (×2): qty 1

## 2019-12-06 MED ORDER — POTASSIUM CHLORIDE 20 MEQ PO PACK: 40.0000 meq | PACK | ORAL | Status: DC

## 2019-12-06 MED ORDER — MORPHINE SULFATE (PF) 2 MG/ML IV SOLN
2.0000 mg | INTRAVENOUS | Status: DC | PRN
  Administered 2019-12-06 – 2019-12-08 (×5): 2 mg via INTRAVENOUS
  Filled 2019-12-06 (×7): qty 1

## 2019-12-06 MED ORDER — FENTANYL CITRATE (PF) 100 MCG/2ML IJ SOLN
25.0000 ug | Freq: Once | INTRAMUSCULAR | Status: AC
  Administered 2019-12-06: 25 ug via INTRAVENOUS
  Filled 2019-12-06: qty 2

## 2019-12-06 MED ORDER — SODIUM CHLORIDE 0.9 % IV SOLN
500.0000 mg | Freq: Once | INTRAVENOUS | Status: AC
  Administered 2019-12-06: 18:00:00 500 mg via INTRAVENOUS
  Filled 2019-12-06: qty 10

## 2019-12-06 MED ORDER — ALPRAZOLAM 0.5 MG PO TABS
0.5000 mg | ORAL_TABLET | Freq: Three times a day (TID) | ORAL | Status: DC | PRN
  Administered 2019-12-06 – 2019-12-07 (×2): 0.5 mg via ORAL
  Filled 2019-12-06 (×3): qty 1

## 2019-12-06 MED ORDER — HALOPERIDOL LACTATE 5 MG/ML IJ SOLN
5.0000 mg | Freq: Once | INTRAMUSCULAR | Status: AC
  Administered 2019-12-06: 02:00:00 5 mg via INTRAVENOUS
  Filled 2019-12-06: qty 1

## 2019-12-06 MED ORDER — DONEPEZIL HCL 5 MG PO TABS
10.0000 mg | ORAL_TABLET | Freq: Every day | ORAL | Status: DC
  Administered 2019-12-07 – 2019-12-08 (×2): 10 mg via ORAL
  Filled 2019-12-06 (×2): qty 2

## 2019-12-06 MED ORDER — DULOXETINE HCL 30 MG PO CPEP
30.0000 mg | ORAL_CAPSULE | Freq: Every day | ORAL | Status: DC
  Administered 2019-12-07 – 2019-12-09 (×3): 30 mg via ORAL
  Filled 2019-12-06 (×4): qty 1

## 2019-12-06 MED ORDER — LACOSAMIDE 50 MG PO TABS
150.0000 mg | ORAL_TABLET | Freq: Two times a day (BID) | ORAL | Status: DC
  Filled 2019-12-06: qty 3

## 2019-12-06 MED ORDER — POTASSIUM CHLORIDE 10 MEQ/100ML IV SOLN
10.0000 meq | INTRAVENOUS | Status: AC
  Administered 2019-12-06 (×4): 10 meq via INTRAVENOUS
  Filled 2019-12-06 (×4): qty 100

## 2019-12-06 MED ORDER — CARBAMAZEPINE 100 MG PO CHEW
100.0000 mg | CHEWABLE_TABLET | Freq: Four times a day (QID) | ORAL | Status: DC
  Administered 2019-12-07 – 2019-12-09 (×11): 100 mg via ORAL
  Filled 2019-12-06 (×16): qty 1

## 2019-12-06 NOTE — Progress Notes (Signed)
Received eval order to assess swallowing again today. Pt was evaluated yesterday with the recommendation for puree with with liquids. Nsg reports change in status with Pt barely moving his tongue or mouth. Tx today to determine if Pt can tolerate PO's. Jaw is painful to light touch along with any facial movement. Pt with history of trigeminal neuralgia, seemingly flared.Pt want to attempt a piece of ice but had to spit it out as it was too painful to manipulate the bolus. Pt also attempted to self feed a bite of applesauce. He was not able to close his lips around the spoon secondary to pain. Discussed with RN who notified MD for possible pain meds. Pt given a warm blanket to wrap around his head/ jaw to help ease pain. No further boluses attempted. Rec resume pureed diet once pain has subsided and Pt can move his mouth or tongue. ST to follow up as indicated.

## 2019-12-06 NOTE — ED Notes (Signed)
Pt resting in bed with eyes closed, rise and fall of chest noted.

## 2019-12-06 NOTE — ED Notes (Signed)
Pt took a couple bites of apple sauce with meds.

## 2019-12-06 NOTE — ED Notes (Signed)
Pt in bed, anxious, HR in the 120's. reassured and explained I would be in and out. Pt HR back in the 90's. Sitting up in bed, no complaints at this time.

## 2019-12-06 NOTE — ED Notes (Signed)
Pt states his throat and tongue hurt, Arbie Cookey, RN at bedside states she will get pain med for him on 1C.

## 2019-12-06 NOTE — ED Notes (Signed)
This RN spoke to MD, made her aware that pt c/o hurting tongue, it appeared swollen to this RN. md states she will take a look at it.

## 2019-12-06 NOTE — Progress Notes (Signed)
  Speech Language Pathology Treatment: Dysphagia  Patient Details Name: Monserrat Linarez MRN: [id number] DOB: 02/20/1941 Today's Date: 12/06/2019 Time: [id number] SLP Time Calculation (min) (ACUTE ONLY): 35 min  Assessment / Plan / Recommendation Clinical Impression  Received eval order to assess swallowing again today. Pt was evaluated yesterday with the recommendation for puree with with liquids. Nsg reports change in status with Pt barely moving his tongue or mouth. Tx today to determine if Pt can tolerate PO's. Jaw is painful to light touch along with any facial movement. Pt with history of trigeminal neuralgia, seemingly flared.Pt want to attempt a piece of ice but had to spit it out as it was too painful to manipulate the bolus. Pt also attempted to self feed a bite of applesauce. He was not able to close his lips around the spoon secondary to pain. Discussed with RN who notified MD for possible pain meds. Pt given a warm blanket to wrap around his head/ jaw to help ease pain. No further boluses attempted. Rec resume pureed diet once pain has subsided and Pt can move his mouth or tongue. ST to follow up as indicated.  HPI        SLP Plan   F/u with Pt. Once trigeminal neuralgia is under control, resume dysphagia 1 diet with thin liquids       Recommendations  Diet recommendations: NPO;Dysphagia 1 (puree) Liquids provided via: Cup                SLP Visit Diagnosis: Dysphagia, oropharyngeal phase (R13.12)       GO                Lucila Maine 12/06/2019, 12:44 PM

## 2019-12-06 NOTE — Progress Notes (Addendum)
PROGRESS NOTE    Chad Allen  [id number] DOB: Feb 25, 1941 DOA: 12/04/2019  PCP: Alvester Morin, MD    LOS - 1   Brief Narrative:  78 y.o. male nursing home resident with a history of dementia, gastrostomy tube, on chronic home O2, non-insulin-dependent type 2 diabetes and hypertension, sent to the ED after he was found with decreased responsiveness, drooling on himself and appearing to be short of breath.  Arrival of EMS they recorded an O2 sat of 84% on room air that improved to 97% on nonrebreather.  By arrival in the emergency room O2 sats in the high 90s via nonrebreather.  He was afebrile but pulse was 114 and respirations 25.  After short time in the emergency room he became more responsive and was transitioned to O2 by nasal cannula at 4 L.  ABG showed a pH of 7.46, PCO2 of 46 and PO2 of 106.  Labs notable for WBC 12k, mild hypokalemia potassium of 3.3,  elevated inflammatory markers including ferritin 82 triglycerides 120 LDH 151 and elevated D-dimer.  Procalcitonin was up at 0.37.  UA consistent with UTI.  Patient was treated per sepsis protocol, with IV fluid resuscitation and antibiotics.  CTA chest obtained and ruled out PE.  COVID-19 negative.  He was initially treated for possible CA-PNA and UTI.  Patient remained free of respiratory symptoms since initial presentation, CT personally reviewed and does not appear clinically or by imaging to have pneumonia.  Urine culture growing Klebsiella pneumoniae, antibiotics de-escalated to cefazolin.   Subjective 12/17: Patient seen awake laying in bed.  Complains of mouth pain today, says it's a chronic problem that flares occasionally and interferes with his ability to talk well.  Denies fever/chills, abdominal pain, N/V/D, cough, congestion, chest pain or SOB.  No acute events reported overnight.  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  Vital signs now stable -De-escalate antibiotics cefazolin -Plan to discharge on Keflex to complete course  Mouth pain -chronic, intermittent Nothing visible on exam of the mouth, but patient appears in pain and difficulty talking.  Patient is edentulous. History of trigeminal neuralgia which causing mouth/jaw pain.   - trial of viscous lidocaine, morphine PRN - SLP to evaluate - continue home Tegretol, appears he has not received while kept NPO - will start trial of gabapentin  Hypokalemia - repleting Hypomagnesemia - repleting - monitor and replete for goal K>4.0, [id number]  Acute metabolic encephalopathy -resolved Suspect this was primarily due to infection/sepsis.  -Maintain fall precautions -Aspiration precautions  Acute on chronic respiratory failure with hypoxia -resolved Patient on baseline 2 L/min O2.  Suspect patient's acute hypoxia was due to his altered mental status and likely poor/shallow inspirations at that time, as it resolved once patient more responsive and alert.  Community-acquired pneumonia -ruled out Stopped antibiotics.  Clinically, patient does not have respiratory symptoms and imaging did not show infiltrates.  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  Resume home  Aricept.    DVT prophylaxis: Lovenox   Code Status: DNR  Family Communication: Son by telephone Disposition Plan: Return to SNF.  Expect medically stable  for discharge tomorrow   Consultants:   None  Procedures:   None  Antimicrobials:   Cefazolin, start 12/17  Rocephin and doxycycline, start 12/15, stop 12/70   Objective: Vitals:   12/06/19 0500 12/06/19 0627 12/06/19 0833 12/06/19 1021  BP: 132/84 121/78 (!) 150/69 (!) 171/86  Pulse: 99 91 85 (!) 102  Resp: 16 20  16   Temp:   98.9 F (37.2 C) 98.8 F (37.1 C)  TempSrc:      SpO2: 96% 96% 95% 97%  Weight:      Height:        Intake/Output Summary (Last 24 hours) at 12/06/2019 1228 Last data filed at 12/06/2019 1025 Gross per 24 hour  Intake 2402.52 ml  Output 800 ml  Net 1602.52 ml   Filed Weights   12/04/19 2319  Weight: 81.6 kg    Examination:  General exam: awake, alert, no acute distress Respiratory system: clear to auscultation bilaterally, no wheezes, rales or rhonchi, normal respiratory effort. Cardiovascular system: normal S1/S2, RRR, no JVD, murmurs, rubs, gallops, no pedal edema.   Gastrointestinal system: soft, non-tender, non-distended abdomen, G-tube present  Central nervous system: alert and oriented x3. no gross focal neurologic deficits, 5/5 motor in all 4 extremities,  delayed speech but no slurring Extremities: moves all, no edema, normal tone Skin: dry, intact, normal temperature  Data Reviewed: I have personally reviewed following labs and imaging studies  CBC: Recent Labs  Lab 12/04/19 2342 12/06/19 0626  WBC 12.2* 7.8  NEUTROABS 10.1* 5.6  HGB 11.4* 9.3*  HCT 36.7* 28.6*  MCV 87.4 84.4  PLT 387 [id number]   Basic Metabolic Panel: Recent Labs  Lab 12/04/19 2342 12/06/19 0626  NA 142 145  K 3.3* 3.2*  CL 98 108  CO2 25 27  GLUCOSE 130* 99  BUN 18 14  CREATININE 1.12 0.74  CALCIUM 8.9 8.5*  MG  --  1.6*   GFR: Estimated Creatinine Clearance: 86.2 mL/min (by C-G formula based on SCr of 0.74 mg/dL). Liver Function Tests: Recent Labs  Lab 12/04/19 2342  AST 12*  ALT 8  ALKPHOS 113  BILITOT 0.9  PROT 8.5*  ALBUMIN  3.4*   No results for input(s): LIPASE, AMYLASE in the last 168 hours. No results for input(s): AMMONIA in the last 168 hours. Coagulation Profile: No results for input(s): INR, PROTIME in the last 168 hours. Cardiac Enzymes: No results for input(s): CKTOTAL, CKMB, CKMBINDEX, TROPONINI in the last 168 hours. BNP (last 3 results) No results for input(s): PROBNP in the last 8760 hours. HbA1C: Recent Labs    12/05/19 0603  HGBA1C 4.9   CBG: Recent Labs  Lab 12/05/19 2023 12/06/19 0151 12/06/19 0354 12/06/19 0817 12/06/19 1159  GLUCAP 76 105* 109* 83 96   Lipid Profile: Recent Labs    12/04/19 2336  TRIG 120   Thyroid Function Tests: No results for input(s): TSH, T4TOTAL, FREET4, T3FREE, THYROIDAB in the last 72 hours. Anemia Panel: Recent Labs    12/04/19 2342  FERRITIN 82   Sepsis Labs: Recent Labs  Lab 12/04/19 2342 12/05/19 0058  PROCALCITON 0.39  --   LATICACIDVEN  --  1.3    Recent Results (from the past 240 hour(s))  Blood Culture (routine x 2)     Status: None (Preliminary result)   Collection Time: 12/04/19 11:57  PM   Specimen: BLOOD  Result Value Ref Range Status   Specimen Description BLOOD BLOOD LEFT HAND  Final   Special Requests   Final    BOTTLES DRAWN AEROBIC AND ANAEROBIC Blood Culture adequate volume   Culture   Final    NO GROWTH 1 DAY Performed at [facility], [address] [place], [place] [address]    Report Status PENDING  Incomplete  Blood Culture (routine x 2)     Status: None (Preliminary result)   Collection Time: 12/04/19 11:58 PM   Specimen: BLOOD  Result Value Ref Range Status   Specimen Description BLOOD RIGHT ANTECUBITAL  Final   Special Requests   Final    BOTTLES DRAWN AEROBIC AND ANAEROBIC Blood Culture adequate volume   Culture   Final    NO GROWTH 1 DAY Performed at [facility], [address] [place], [place] [address]    Report Status PENDING  Incomplete  Urine culture     Status:  Abnormal   Collection Time: 12/04/19 11:58 PM   Specimen: Urine, Catheterized  Result Value Ref Range Status   Specimen Description   Final    URINE, CATHETERIZED Performed at [facility], [address] [place], [place] [address]    Special Requests   Final    Normal Performed at [facility], [address] [place], [place] [address]    Culture (A)  Final    >=100,000 COLONIES/mL KLEBSIELLA PNEUMONIAE Two isolates with different morphologies were identified as the same organism.The most resistant organism was reported. Performed at [facility], [place] [address], [place], [place] [address]    Report Status 12/06/2019 FINAL  Final   Organism ID, Bacteria KLEBSIELLA PNEUMONIAE (A)  Final      Susceptibility   Klebsiella pneumoniae - MIC*    AMPICILLIN >=32 RESISTANT Resistant     CEFAZOLIN <=4 SENSITIVE Sensitive     CEFTRIAXONE <=1 SENSITIVE Sensitive     CIPROFLOXACIN <=0.25 SENSITIVE Sensitive     GENTAMICIN <=1 SENSITIVE Sensitive     IMIPENEM <=0.25 SENSITIVE Sensitive     NITROFURANTOIN 128 RESISTANT Resistant     TRIMETH/SULFA <=20 SENSITIVE Sensitive     AMPICILLIN/SULBACTAM 8 SENSITIVE Sensitive     PIP/TAZO <=4 SENSITIVE Sensitive     * >=100,000 COLONIES/mL KLEBSIELLA PNEUMONIAE  SARS CORONAVIRUS 2 (TAT 6-24 HRS) Nasopharyngeal Nasopharyngeal Swab     Status: None   Collection Time: 12/05/19  1:37 AM   Specimen: Nasopharyngeal Swab  Result Value Ref Range Status   SARS Coronavirus 2 NEGATIVE NEGATIVE Final    Comment: (NOTE) SARS-CoV-2 target nucleic acids are NOT DETECTED. The SARS-CoV-2 RNA is generally detectable in upper and lower respiratory specimens during the acute phase of infection. Negative results do not preclude SARS-CoV-2 infection, do not rule out co-infections with other pathogens, and should not be used as the sole basis for treatment or other patient management decisions. Negative results must be combined with  clinical observations, patient history, and epidemiological information. The expected result is Negative. Fact Sheet for Patients: SugarRoll.be Fact Sheet for Healthcare Providers: https://www.woods-mathews.com/ This test is not yet approved or cleared by the [place] FDA and  has been authorized for detection and/or diagnosis of SARS-CoV-2 by FDA under an Emergency Use Authorization (EUA). This EUA will remain  in effect (meaning this test can be used) for the duration of the COVID-19 declaration under Section 56 4(b)(1) of the Act, 21 U.S.C. section 360bbb-3(b)(1),  unless the authorization is terminated or revoked sooner. Performed at [facility], [place] [address], [place], [place] [address]          Radiology Studies: DG Abdomen 1 View  Result Date: 12/05/2019 CLINICAL DATA:  Gastrostomy tube placement EXAM: ABDOMEN - 1 VIEW COMPARISON:  Chest CT from earlier today FINDINGS: Percutaneous gastrojejunostomy tube the with inflated bulb distal stomach. The catheter extends upward and loops through the fundus with tip in the region of the pylorus. Excretory nephrograms from recent CTA. Normal bowel gas pattern. Pelvic lymphadenectomy. The bladder is deviated to the right at least partially from rotation. IMPRESSION: Gastrojejunostomy tube with catheter looping through the gastric fundus and tip in the pyloric region. Electronically Signed   By: Monte Fantasia M.D.   On: 12/05/2019 04:31   CT Angio Chest PE W/Cm &/Or Wo Cm  Result Date: 12/05/2019 CLINICAL DATA:  Worsening hypoxia with elevated D-dimer. EXAM: CT ANGIOGRAPHY CHEST WITH CONTRAST TECHNIQUE: Multidetector CT imaging of the chest was performed using the standard protocol during bolus administration of intravenous contrast. Multiplanar CT image reconstructions and MIPs were obtained to evaluate the vascular anatomy. CONTRAST:  70mL OMNIPAQUE IOHEXOL 350 MG/ML SOLN COMPARISON:   None. FINDINGS: Cardiovascular: Evaluation for pulmonary emboli is limited by suboptimal contrast bolus timing.There is no large centrally located pulmonary embolism. Detection of smaller segmental and subsegmental pulmonary emboli is limited by bolus timing and respiratory motion artifact. The main pulmonary artery is within normal limits for size. There is no CT evidence of acute right heart strain. There are atherosclerotic changes of the thoracic aorta. There is mild narrowing at the origin of the left subclavian artery. Heart size is normal. Coronary artery calcifications are noted. Mediastinum/Nodes: --No mediastinal or hilar lymphadenopathy. --No axillary lymphadenopathy. --No supraclavicular lymphadenopathy. --Normal thyroid gland. --The esophagus is unremarkable Lungs/Pleura: There are mild emphysematous changes bilaterally. There is some mild interlobular septal thickening at the lung apices. There is scarring versus atelectasis in the left lung apex. There is no pneumothorax. There is atelectasis at the lung bases bilaterally. Upper Abdomen: There is a partially visualized catheter in the gastric body and fundus. Musculoskeletal: No chest wall abnormality. No acute or significant osseous findings. Review of the MIP images confirms the above findings. IMPRESSION: 1. No acute pulmonary embolism given the limitations described above. 2. Mild interlobular septal thickening may represent early interstitial edema. 3. Scattered areas of atelectasis without evidence for a focal infiltrate. 4. Partially visualized catheter in the gastric body and fundus. Correlation with history is recommended. If the patient has a gastrojejunostomy tube, this catheter may be malpositioned. An abdominal radiograph would be useful for further evaluation. Aortic Atherosclerosis (ICD10-I70.0) and Emphysema (ICD10-J43.9). Electronically Signed   By: Constance Holster M.D.   On: 12/05/2019 03:46   DG Chest Port 1 View  Result  Date: 12/05/2019 CLINICAL DATA:  Shortness of breath. Altered level of consciousness. EXAM: PORTABLE CHEST 1 VIEW COMPARISON:  None. FINDINGS: Left lung apex not included in the field of view. Lung volumes are low with bronchovascular crowding. Heart size grossly normal. Atelectasis with mild interstitial thickening at the bases, left greater than. No visualized pneumothorax or large pleural effusion. IMPRESSION: Low lung volumes with bronchovascular crowding. Bibasilar atelectasis with basilar interstitial thickening, chronicity uncertain. No element of basilar infection is considered, primarily on the left. Electronically Signed   By: Keith Rake M.D.   On: 12/05/2019 00:27        Scheduled Meds: . amLODipine  10  mg Oral Daily  . atorvastatin  20 mg Oral QHS  . carbamazepine  100 mg Oral QID  . carbamazepine  200 mg Oral QID  . donepezil  10 mg Oral QHS  . DULoxetine  30 mg Oral Daily  . enoxaparin (LOVENOX) injection  40 mg Subcutaneous Q24H  . insulin aspart  0-9 Units Subcutaneous Q4H  . insulin aspart  2 Units Subcutaneous Q4H  . lacosamide  150 mg Oral BID  . losartan  25 mg Oral Daily  . potassium chloride  40 mEq Oral Q4H  . QUEtiapine  25 mg Oral QHS   Continuous Infusions: . sodium chloride Stopped (12/06/19 0254)  .  ceFAZolin (ANCEF) IV    . magnesium sulfate bolus IVPB       LOS: 1 day    Time spent: 35-40 minutes    Ezekiel Slocumb, DO [facility] Pager: [phone]  If 7PM-7AM, please contact night-coverage [email or web address] Password [facility] 12/06/2019, 12:28 PM

## 2019-12-07 ENCOUNTER — Inpatient Hospital Stay: Payer: Medicare Other

## 2019-12-07 DIAGNOSIS — Z931 Gastrostomy status: Secondary | ICD-10-CM

## 2019-12-07 DIAGNOSIS — K1379 Other lesions of oral mucosa: Secondary | ICD-10-CM

## 2019-12-07 LAB — GLUCOSE, CAPILLARY
Glucose-Capillary: 96 mg/dL (ref 70–99)
Glucose-Capillary: 135 mg/dL — ABNORMAL HIGH (ref 70–99)
Glucose-Capillary: 122 mg/dL — ABNORMAL HIGH (ref 70–99)
Glucose-Capillary: 100 mg/dL — ABNORMAL HIGH (ref 70–99)
Glucose-Capillary: 102 mg/dL — ABNORMAL HIGH (ref 70–99)
Glucose-Capillary: 118 mg/dL — ABNORMAL HIGH (ref 70–99)

## 2019-12-07 LAB — BASIC METABOLIC PANEL
Anion gap: 16 — ABNORMAL HIGH (ref 5–15)
BUN: 11 mg/dL (ref 8–23)
CO2: 20 mmol/L — ABNORMAL LOW (ref 22–32)
Calcium: 8.9 mg/dL (ref 8.9–10.3)
Chloride: 106 mmol/L (ref 98–111)
Creatinine, Ser: 0.88 mg/dL (ref 0.61–1.24)
GFR calc Af Amer: >60 mL/min (ref >60)
GFR calc non Af Amer: >60 mL/min (ref >60)
Glucose, Bld: 112 mg/dL — ABNORMAL HIGH (ref 70–99)
Potassium: 3.5 mmol/L (ref 3.5–5.1)
Sodium: 142 mmol/L (ref 135–145)

## 2019-12-07 LAB — MAGNESIUM: Magnesium: 1.7 mg/dL (ref 1.7–2.4)

## 2019-12-07 MED ORDER — JEVITY 1.5 CAL/FIBER PO LIQD: 1000.0000 mL | ORAL | Status: DC

## 2019-12-07 MED ORDER — JEVITY 1.5 CAL/FIBER PO LIQD
1000.0000 mL | ORAL | Status: DC
  Administered 2019-12-07 – 2019-12-08 (×2): 1000 mL

## 2019-12-07 MED ORDER — FREE WATER
150.0000 mL | Status: DC
  Administered 2019-12-07 – 2019-12-09 (×13): 150 mL

## 2019-12-07 MED ORDER — METOPROLOL TARTRATE 5 MG/5ML IV SOLN
5.0000 mg | INTRAVENOUS | Status: DC | PRN
  Filled 2019-12-07: qty 5

## 2019-12-07 MED ORDER — SODIUM CHLORIDE 0.9 % IV SOLN
INTRAVENOUS | Status: DC | PRN
  Administered 2019-12-07: 1000 mL via INTRAVENOUS
  Administered 2019-12-07 – 2019-12-09 (×3): 250 mL via INTRAVENOUS

## 2019-12-07 MED ORDER — POTASSIUM CHLORIDE 20 MEQ/15ML (10%) PO SOLN
30.0000 meq | Freq: Once | ORAL | Status: AC
  Administered 2019-12-07: 12:00:00 30 meq
  Filled 2019-12-07: qty 30

## 2019-12-07 MED ORDER — LABETALOL HCL 5 MG/ML IV SOLN: 10.0000 mg | INTRAVENOUS | Status: DC | PRN

## 2019-12-07 MED ORDER — MORPHINE SULFATE (PF) 2 MG/ML IV SOLN
2.0000 mg | Freq: Once | INTRAVENOUS | Status: AC
  Administered 2019-12-07: 2 mg via INTRAVENOUS
  Filled 2019-12-07: qty 1

## 2019-12-07 MED ORDER — MAGNESIUM SULFATE 2 GM/50ML IV SOLN
2.0000 g | Freq: Once | INTRAVENOUS | Status: AC
  Administered 2019-12-07: 2 g via INTRAVENOUS
  Filled 2019-12-07: qty 50

## 2019-12-07 MED ORDER — SODIUM CHLORIDE 0.9 % IV SOLN
150.0000 mg | Freq: Two times a day (BID) | INTRAVENOUS | Status: DC
  Administered 2019-12-07 – 2019-12-09 (×6): 150 mg via INTRAVENOUS
  Filled 2019-12-07 (×7): qty 15

## 2019-12-07 NOTE — Progress Notes (Signed)
  Speech Language Pathology Treatment: Dysphagia  Patient Details Name: Chad Allen MRN: [id number] DOB: 09-29-1941 Today's Date: 12/07/2019 Time: [id number] SLP Time Calculation (min) (ACUTE ONLY): 24 min  Assessment / Plan / Recommendation Clinical Impression  Treatment today to reassess for restarting diet. Pt was evaluated on 12/16 with recommendations for pureed diet with thin liquids which he tolerated without difficulty at the time. 12/17, Pt exhibited what symptoms of trigeminal neuralgia flare and was not able to eat or drink without significant pain. Pt was made NPO. Today, pt is able to speak more clearly and manipulate pureed and thin liquid boluses. Oral transit is delayed but Pt swallowed with no s/s of aspiration. Pt tolerated the liquids better from a straw as he reports lip pain with pressure from the cup. Notified MD of findings and plans to restart a dysphgia1 diet with thin liquids. Intake is likely to be poor as Pt refused after only 2 bites but did drink several sips of water from a straw. ST to f/u with toleration of diet. Pt does have a feeding tube that is not currently being used. Hopefully PO intake will improve howevere, unlikley enough to meet nutritional needs especially if Pt continues to have trigeminal neuralgia flare ups. Discussed with Dietitian who also assessed Pt and plans to recommend starting tube feedings as Pt has had a significant weight loss. ST to follow up with toleration of PO diet.    HPI HPI: Pt is a 78 y.o. male nursing home resident with a history of Dementia, gastrostomy tube d/t Trigeminal Neuralgia - now resolved and pt has been on an oral diet for months attempting to see IR/MD to have the PEG removed), on chronic home O2, non-insulin-dependent type 2 diabetes and hypertension, mood dis., PTSD, and other medical issues who was sent to the emergency room after he was found with decreased responsiveness, O2 sats, and drooling on himself and  appearing to be short of breath.  Arrival of EMS they recorded an O2 sat of 84% on room air that improved to 97% on nonrebreather.  By arrival in the emergency room O2 sats in the high 90s via nonrebreather.  He was afebrile but pulse was 114 and respirations 25.  After short time in the emergency room he became more responsive and was transitioned to O2 by nasal cannula at 4 L.  Pt is verbally responsive but confused.; follows basic commands.  Pt is admitted for suspected UTI and sepsis.  CXR/CT: "Low lung volumes with bronchovascular crowding. Bibasilar atelectasis, possible edema".       SLP Plan  Continue with current plan of care       Recommendations  Diet recommendations: Dysphagia 1 (puree);Thin liquid Liquids provided via: Straw Medication Administration: Crushed with puree Supervision: Intermittent supervision to cue for compensatory strategies Compensations: Slow rate Postural Changes and/or Swallow Maneuvers: Seated upright 90 degrees;Upright 30-60 min after meal       Dietitian following for poor nutrition and weight loss Consider ENTconsult         SLP Visit Diagnosis: Dysphagia, oropharyngeal phase (R13.12) Plan: Continue with current plan of care       GO                Chad Allen 12/07/2019, 11:25 AM

## 2019-12-07 NOTE — Progress Notes (Signed)
Rapid Response RN called to check on patient after elevated heart rate in the  160s. Patient was resting comfortably in bed for this RN. Vital signs stable (see flowsheet) when this RN arrived, patient reported no pain. Patient was alert and could state name but not where he was. Touched base with Malka, RN (patient's nurse) who had already notified the doctor about heart rate change. Electrolyte replacement already ordered by doctor.

## 2019-12-07 NOTE — Progress Notes (Addendum)
Initial Nutrition Assessment  DOCUMENTATION CODES:   Not applicable  INTERVENTION:   Jevity 1.5 '@60ml'$ /hr- Initiate at 44m/hr and increase by 127mhr q 8 hours until goal rate is reached.   Free water flushes 15049m4 hours   Regimen provides 2160kca/day, 92g/day protein, 30g/day fiber, 1994m26my free water   Tube feeds at goal rate meets 100% of patients micronutrient needs   Discontinue IVF per MD  Pt at moderate refeed risk; recommend monitor K, Mg and P labs daily until stable.   NUTRITION DIAGNOSIS:   Inadequate oral intake related to inability to eat(trigeminal neuralgia) as evidenced by per patient/family report.  GOAL:   Patient will meet greater than or equal to 90% of their needs  MONITOR:   PO intake, Labs, Weight trends, TF tolerance, Skin, I & O's  REASON FOR ASSESSMENT:   Consult Assessment of nutrition requirement/status  ASSESSMENT:   77 y74. 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 H. Pylori negative  Pt s/p IR placement of balloon retained 80F gastrojejunostomy tube 3/31- Per radiographs from 12/16, gastrojejunostomy tube with catheter looping through the gastric fundus and tip in the pyloric region.  RD extensively reviewed pt's chart. It appears that after multiple admissions for trigeminal neuralgia at [facility] had a G-J tube placed for tube feedings as pt is unable to eat r/t severe pain at times when he is having a flare. Pt resides at [facility] reports that patient has not been using his feeding tube for the past 3 months but has been eating by mouth. Met with pt in room today who confirms that he has not  been using his tube and that he prefers to eat by mouth. Per chart review, pt has lost 36lbs(17%) since having his tube placed in March; this is significant. RD believes that although patient is eating by mouth, he is not taking in enough to meet his needs. Pt reluctant to restart tube feeds today; pt prefers to eat by mouth. RD had extensive discussion with patient regarding the importance of adequate nutrition needed to preserve lean muscle. Pt is already bed-bound. Pt is aware that he has lost weight but was unsure of how much. Pt seen by SLP today who recommended dysphagia 1/thin liquid diet. Pt is edentulous. Per SLP, patient was only able to eat a few bites today. Pt finally agreed to restart tube feedings after he was made aware that he can still eat. RD will adjust tube feeds based on patient's oral intake. Pt is at refeed risk; recommend monitor electrolytes until stable.   Addendum: Spoke with RN, J-port of pt's tube is clogged; will plan to go ahead and feed via the G-port for now.   Medications reviewed and include: lovenox, NaCl '@100ml'$ /hr, insulin prn, KCl, cefazolin, Mg sulfate  Labs reviewed: Na 142 wnl, K 3.5 wnl, Mg 1.7 wnl Hgb 9.3(L), Hct 28.6(L) cbgs- 96, 135, 122, 100 x 48 hrs  NUTRITION - FOCUSED PHYSICAL EXAM:    Most Recent Value  Orbital Region  No depletion  Upper Arm Region  Moderate depletion  Thoracic and Lumbar Region  Mild depletion  Buccal Region  No depletion  Temple Region  Mild depletion  Clavicle Bone  Region  Mild depletion  Clavicle and Acromion Bone Region  Mild depletion  Scapular Bone Region  Unable to assess  Dorsal Hand  Mild depletion  Patellar Region  Mild depletion  Anterior Thigh Region  Mild depletion  Posterior Calf Region  Moderate depletion  Edema (RD Assessment)  None  Hair  Reviewed  Eyes  Reviewed  Mouth  Reviewed  Skin  Reviewed  Nails  Reviewed     Diet Order:   Diet Order            DIET - DYS 1 Room service appropriate? Yes;  Fluid consistency: Thin  Diet effective now             EDUCATION NEEDS:   Education needs have been addressed  Skin:  Skin Assessment: Reviewed RN Assessment(ecchymosis)  Last BM:  12/18- type 7  Height:   Ht Readings from Last 1 Encounters:  12/04/19 6' (1.829 m)    Weight:   Wt Readings from Last 1 Encounters:  12/07/19 83.7 kg    Ideal Body Weight:  80.9 kg  BMI:  Body mass index is 25.02 kg/m.  Estimated Nutritional Needs:   Kcal:  1900-2200kcal/day  Protein:  95-110g/day  Fluid:  2.1-2.4L/day  Koleen Distance MS, RD, LDN Pager #- [phone] Office#- [phone] After Hours Pager: [phone]

## 2019-12-07 NOTE — Progress Notes (Signed)
PROGRESS NOTE    Chad Allen  [id number] DOB: Sep 10, 1941 DOA: 12/04/2019  PCP: Alvester Morin, MD    LOS - 2   Brief Narrative:  78 y.o.malenursing home resident with a history of dementia, gastrostomy tube, on chronic home O2, non-insulin-dependent type 2 diabetes and hypertension, sent to the ED after he was found with decreased responsiveness, drooling on himself and appearing to be short of breath.Arrival of EMS they recorded an O2 sat of 84% on room air that improved to 97% on nonrebreather. By arrival in the emergency room O2 sats in the high 90s via nonrebreather. He was afebrile but pulse was 114 and respirations 25. After short time in the emergency room he became more responsive and was transitioned to O2 by nasal cannula at 4 L.ABG showed a pH of 7.46, PCO2 of 46 and PO2 of 106.  Labs notable for WBC 12k, mild hypokalemia potassium of 3.3, elevated inflammatory markers including ferritin 82 triglycerides 120 LDH 151 and elevated D-dimer. Procalcitonin was up at 0.37. UA consistent with UTI. Patient was treated per sepsis protocol, with IV fluid resuscitation and antibiotics.  CTA chest obtained and ruled out PE.  COVID-19 negative.  He was initially treated for possible CA-PNA and UTI.  Patient remained free of respiratory symptoms since initial presentation, CT personally reviewed and does not appear clinically or by imaging to have pneumonia.  Urine culture growing Klebsiella pneumoniae, antibiotics de-escalated to cefazolin.  Patient has history of trigeminal neuralgia, has been flared during admission, likely due to being NPO and not getting Tegretol.  Patient has associated difficulty talking and swallowing due to pain.  Medications resumed by his G tube.  Subjective 12/17: Patient seen awake in bed this AM.  Appears in less pain, not writhing in bed like yesterday, but says pain still severe and none of medications yesterday helped it.  He denies  other complaints.      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  Vital signs now stable -continue cefazolin, to stop 12/19  Mouth pain due to Trigeminal neuralgia - acutely flared - trial of viscous lidocaine, morphine PRN - SLP to evaluate - resume Tegretol by G tube today - continue trial of gabapentin for now  Hypokalemia - repleting Hypomagnesemia - repleting - monitor and replete for goal K>4.0, 123XX123  Acute metabolic encephalopathy -resolved Suspect this was primarily due to infection/sepsis.  -Maintain fall precautions -Aspiration precautions  Acute on chronic respiratory failure with hypoxia -resolved Patient on baseline 2 L/min O2.  Suspect patient's acute hypoxia was due to his altered mental status and likely poor/shallow inspirations at that time, as it resolved once patient more responsive and alert.  Has not recurred.  Community-acquired pneumonia -ruled out Stopped antibiotics for this.  Clinically, patient does not have respiratory symptoms and imaging did not show infiltrates.  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  Resume home Aricept.   DVT  prophylaxis: Lovenox   Code Status: DNR  Family Communication: none at bedside  Disposition Plan:  Return to SNF, likely tomorrow, pending control of pain and tolerating  tube feedings.   Consultants:   SLP  Dietician  Procedures:   None  Antimicrobials:   Cefazolin, start 12/17, stop 12/19  Rocephin and Doxycycline, start 12/15, stopped 12/17    Objective: Vitals:   12/06/19 1924 12/07/19 0500 12/07/19 0506 12/07/19 0627  BP: (!) 174/79 (!) 194/98 (!) 179/88 (!) 188/86  Pulse: (!) 102 (!) 101 (!) 101 (!) 110  Resp: 17 18    Temp: 98.1 F (36.7 C) 98.8 F (37.1 C)  98.6 F (37 C)  TempSrc:  Oral  Oral  SpO2: 95% 96%  98%  Weight:      Height:        Intake/Output Summary (Last 24 hours) at 12/07/2019 0724 Last data filed at 12/07/2019 0500 Gross per 24 hour  Intake 1387.53 ml  Output 2450 ml  Net -1062.47 ml   Filed Weights   12/04/19 2319  Weight: 81.6 kg    Examination:  General exam: awake, alert, no acute distress, underweight HEENT: moist mucus membranes, hearing grossly normal, mouth pain limits speech today  Respiratory system: clear to auscultation bilaterally, no wheezes, rales or rhonchi, normal respiratory effort. Cardiovascular system: normal S1/S2, RRR, no pedal edema.   Gastrointestinal system: soft, non-tender, non-distended abdomen, G tube in place Central nervous system: alert and oriented x3. no gross focal neurologic deficits Extremities: moves all, no edema, normal tone    Data Reviewed: I have personally reviewed following labs and imaging studies  CBC: Recent Labs  Lab 12/04/19 2342 12/06/19 0626  WBC 12.2* 7.8  NEUTROABS 10.1* 5.6  HGB 11.4* 9.3*  HCT 36.7* 28.6*  MCV 87.4 84.4  PLT 387 [id number]   Basic Metabolic Panel: Recent Labs  Lab 12/04/19 2342 12/06/19 0626  NA 142 145  K 3.3* 3.2*  CL 98 108  CO2 25 27  GLUCOSE 130* 99  BUN 18 14  CREATININE 1.12 0.74  CALCIUM 8.9 8.5*  MG  --  1.6*   GFR: Estimated Creatinine Clearance: 86.2 mL/min (by C-G formula based on SCr of 0.74 mg/dL). Liver Function Tests: Recent Labs  Lab 12/04/19 2342  AST 12*  ALT 8  ALKPHOS 113    BILITOT 0.9  PROT 8.5*  ALBUMIN 3.4*   No results for input(s): LIPASE, AMYLASE in the last 168 hours. No results for input(s): AMMONIA in the last 168 hours. Coagulation Profile: No results for input(s): INR, PROTIME in the last 168 hours. Cardiac Enzymes: No results for input(s): CKTOTAL, CKMB, CKMBINDEX, TROPONINI in the last 168 hours. BNP (last 3 results) No results for input(s): PROBNP in the last 8760 hours. HbA1C: Recent Labs    12/05/19 0603  HGBA1C 4.9   CBG: Recent Labs  Lab 12/06/19 1633 12/06/19 2028 12/06/19 2342 12/07/19 0456 12/07/19 0625  GLUCAP 112* 111* 104* 96 135*   Lipid Profile: Recent Labs    12/04/19 2336  TRIG 120   Thyroid Function Tests: No results for input(s): TSH, T4TOTAL, FREET4, T3FREE, THYROIDAB in the last 72 hours. Anemia Panel: Recent Labs    12/04/19 2342  FERRITIN 82   Sepsis Labs: Recent Labs  Lab 12/04/19 2342 12/05/19 0058  PROCALCITON 0.39  --   LATICACIDVEN  --  1.3    Recent Results (from the past 240 hour(s))  Blood Culture (routine x 2)     Status:  None (Preliminary result)   Collection Time: 12/04/19 11:57 PM   Specimen: BLOOD  Result Value Ref Range Status   Specimen Description BLOOD BLOOD LEFT HAND  Final   Special Requests   Final    BOTTLES DRAWN AEROBIC AND ANAEROBIC Blood Culture adequate volume   Culture   Final    NO GROWTH 1 DAY Performed at [facility], [address] [place], [place] [address]    Report Status PENDING  Incomplete  Blood Culture (routine x 2)     Status: None (Preliminary result)   Collection Time: 12/04/19 11:58 PM   Specimen: BLOOD  Result Value Ref Range Status   Specimen Description BLOOD RIGHT ANTECUBITAL  Final   Special Requests   Final    BOTTLES DRAWN AEROBIC AND ANAEROBIC Blood Culture adequate volume   Culture   Final    NO GROWTH 1 DAY Performed at [facility], [address], [place], [place] [address]    Report Status PENDING   Incomplete  Urine culture     Status: Abnormal   Collection Time: 12/04/19 11:58 PM   Specimen: Urine, Catheterized  Result Value Ref Range Status   Specimen Description   Final    URINE, CATHETERIZED Performed at [facility], [address] [place], [place] [address]    Special Requests   Final    Normal Performed at [facility] [address] [place], [place] [address]    Culture (A)  Final    >=100,000 COLONIES/mL KLEBSIELLA PNEUMONIAE Two isolates with different morphologies were identified as the same organism.The most resistant organism was reported. Performed at [facility], [place] [address], [place], [place] [address]    Report Status 12/06/2019 FINAL  Final   Organism ID, Bacteria KLEBSIELLA PNEUMONIAE (A)  Final      Susceptibility   Klebsiella pneumoniae - MIC*    AMPICILLIN >=32 RESISTANT Resistant     CEFAZOLIN <=4 SENSITIVE Sensitive     CEFTRIAXONE <=1 SENSITIVE Sensitive     CIPROFLOXACIN <=0.25 SENSITIVE Sensitive     GENTAMICIN <=1 SENSITIVE Sensitive     IMIPENEM <=0.25 SENSITIVE Sensitive     NITROFURANTOIN 128 RESISTANT Resistant     TRIMETH/SULFA <=20 SENSITIVE Sensitive     AMPICILLIN/SULBACTAM 8 SENSITIVE Sensitive     PIP/TAZO <=4 SENSITIVE Sensitive     * >=100,000 COLONIES/mL KLEBSIELLA PNEUMONIAE  SARS CORONAVIRUS 2 (TAT 6-24 HRS) Nasopharyngeal Nasopharyngeal Swab     Status: None   Collection Time: 12/05/19  1:37 AM   Specimen: Nasopharyngeal Swab  Result Value Ref Range Status   SARS Coronavirus 2 NEGATIVE NEGATIVE Final    Comment: (NOTE) SARS-CoV-2 target nucleic acids are NOT DETECTED. The SARS-CoV-2 RNA is generally detectable in upper and lower respiratory specimens during the acute phase of infection. Negative results do not preclude SARS-CoV-2 infection, do not rule out co-infections with other pathogens, and should not be used as the sole basis for treatment or other patient management  decisions. Negative results must be combined with clinical observations, patient history, and epidemiological information. The expected result is Negative. Fact Sheet for Patients: SugarRoll.be Fact Sheet for Healthcare Providers: https://www.woods-mathews.com/ This test is not yet approved or cleared by the [place] FDA and  has been authorized for detection and/or diagnosis of SARS-CoV-2 by FDA under an Emergency Use Authorization (EUA). This EUA will remain  in effect (meaning this test can be used) for the duration of the COVID-19 declaration under Section  56 4(b)(1) of the Act, 21 U.S.C. section 360bbb-3(b)(1), unless the authorization is terminated or revoked sooner. Performed at [facility], [place] [address], [place], [place] [address]   MRSA PCR Screening     Status: None   Collection Time: 12/06/19  5:30 PM   Specimen: Nasal Mucosa; Nasopharyngeal  Result Value Ref Range Status   MRSA by PCR NEGATIVE NEGATIVE Final    Comment:        The GeneXpert MRSA Assay (FDA approved for NASAL specimens only), is one component of a comprehensive MRSA colonization surveillance program. It is not intended to diagnose MRSA infection nor to guide or monitor treatment for MRSA infections. Performed at [facility], [address], [place], [place] [address]          Radiology Studies: DG Abd 1 View  Result Date: 12/07/2019 CLINICAL DATA:  Gastrostomy tube.  Diabetes.  Hypertension. EXAM: ABDOMEN - 1 VIEW COMPARISON:  12/05/2019. FINDINGS: Gastrostomy tube noted in unchanged position looped in stomach with tip over the pyloric region. No gastric or bowel distention. No free air. Degenerative change lumbar spine and both hips. Prior lumbar spine fusion. Surgical clips in the pelvis. Penile prosthesis noted. Aortoiliac atherosclerotic vascular calcification. IMPRESSION: Gastrostomy tube in unchanged position looped in  stomach with tip over the pyloric region. No gastric or bowel distention. Exam is stable from prior exam. Electronically Signed   By: [place]   On: 12/07/2019 07:21        Scheduled Meds: . amLODipine  10 mg Oral Daily  . atorvastatin  20 mg Oral QHS  . carbamazepine  100 mg Oral QID  . carbamazepine  200 mg Oral QID  . donepezil  10 mg Oral QHS  . DULoxetine  30 mg Oral Daily  . enoxaparin (LOVENOX) injection  40 mg Subcutaneous Q24H  . gabapentin  200 mg Oral Q8H  . insulin aspart  0-9 Units Subcutaneous Q4H  . insulin aspart  2 Units Subcutaneous Q4H  . losartan  25 mg Oral Daily  . QUEtiapine  25 mg Oral QHS   Continuous Infusions: . sodium chloride 100 mL/hr at 12/07/19 [id number]  . sodium chloride 250 mL (12/07/19 0013)  .  ceFAZolin (ANCEF) IV Stopped (12/07/19 0044)  . lacosamide (VIMPAT) IV Stopped (12/07/19 0240)     LOS: 2 days    Time spent: 30-35 minutes    Ezekiel Slocumb, DO [facility] Pager: [phone]  If 7PM-7AM, please contact night-coverage [email or web address] Password [facility] 12/07/2019, 7:24 AM

## 2019-12-07 NOTE — Plan of Care (Signed)
Pt confused. Alert to self and place. Denies pain throughout the shift.  Pt did not eat or drink during the shift.  HR at times up to 150's, but not sustaining.  Rest of VSS. O2 sats on RA 90%, continue 2L O2 per [place].  Tube feeding initiated at 50ml per hr. Pt tolerates tube feeding.

## 2019-12-07 NOTE — TOC Initial Note (Signed)
Transition of Care [facility]) - Initial/Assessment Note    Patient Details  Name: Chad Allen MRN: [id number] Date of Birth: November 02, 1941  Transition of [facility]) CM/SW Contact:    Shelbie Hutching, RN Phone Number: 12/07/2019, 9:53 AM  Clinical Narrative:                 Patient admitted with Sepsis from [facility].  Patient is a long term resident at [facility] contracted through the [place].  Per [facility] the patient is bed bound.  Patient has a G tube but they do not use it for feeds at the facility he has been eating.  Patient is on chronic O2 at 2L.    Plan will be for patient to return to [facility] at discharge.  RNCM has left a message with patient's daughter for return call just to touch base and update on plan of care.    Expected Discharge Plan: Skilled Nursing Facility Barriers to Discharge: Continued Medical Work up   Patient Goals and CMS Choice        Expected Discharge Plan and Services Expected Discharge Plan: [place]   Discharge Planning Services: CM Consult   Living arrangements for the past 2 months: [place]                                      Prior Living Arrangements/Services Living arrangements for the past 2 months: [place] Lives with:: Facility Resident Patient language and need for interpreter reviewed:: Yes Do you feel safe going back to the place where you live?: Yes      Need for Family Participation in Patient Care: Yes (Comment)(dementia - long term care resident) Care giver support system in place?: Yes (comment)   Criminal Activity/Legal Involvement Pertinent to Current Situation/Hospitalization: No - Comment as needed  Activities of Daily Living Home Assistive Devices/Equipment: Other (Comment) ADL Screening (condition at time of admission) Patient's cognitive ability adequate to safely complete daily activities?: Yes Is the patient deaf or have difficulty  hearing?: No Does the patient have difficulty seeing, even when wearing glasses/contacts?: No Does the patient have difficulty concentrating, remembering, or making decisions?: Yes Patient able to express need for assistance with ADLs?: Yes Does the patient have difficulty dressing or bathing?: Yes Independently performs ADLs?: No Communication: Independent Is this a change from baseline?: Pre-admission baseline Dressing (OT): Dependent Is this a change from baseline?: Pre-admission baseline Grooming: Dependent Feeding: Independent Bathing: Needs assistance Is this a change from baseline?: Pre-admission baseline Toileting: Dependent Is this a change from baseline?: Pre-admission baseline In/Out Bed: Dependent Is this a change from baseline?: Pre-admission baseline Walks in Home: Dependent Is this a change from baseline?: Pre-admission baseline Does the patient have difficulty walking or climbing stairs?: Yes Weakness of Legs: Both Weakness of Arms/Hands: Both  Permission Sought/Granted Permission sought to share information with : Case Manager, Customer service manager, Family Supports Permission granted to share information with : Yes, Verbal Permission Granted     Permission granted to share info w AGENCY: [place] granted to share info w Relationship: Daughter     Emotional Assessment       Orientation: : Oriented to Self, Oriented to Place Alcohol / Substance Use: Not Applicable Psych Involvement: No (comment)  Admission diagnosis:  Acute respiratory failure ([place]) [J96.00] DNR (do not resuscitate) [Z66] Gastrostomy  in place [facility]) [Z93.1] Elevated d-dimer [R79.89] Urinary tract infection without hematuria, site unspecified [N39.0] Altered mental status, unspecified altered mental status type [R41.82] Acute respiratory failure with hypoxemia ([place]) 0000000 Acute metabolic encephalopathy 99991111 Dementia without behavioral disturbance,  unspecified dementia type ([place]) [F03.90] Sepsis, due to unspecified organism, unspecified whether acute organ dysfunction present ([place]) [A41.9] Suspected COVID-19 virus infection [Z20.828] Patient Active Problem List   Diagnosis Date Noted  . Hypokalemia 12/06/2019  . Hypomagnesemia 12/06/2019  . Mouth pain 12/06/2019  . Acute metabolic encephalopathy 123XX123  . Sepsis ([place]) 12/05/2019  . UTI (urinary tract infection) 12/05/2019  . Suspected COVID-19 virus infection 12/05/2019  . 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 Dementia without behavioral disturbance ([place]) 12/05/2019  . Acute on chronic respiratory failure with hypoxia ([place]) 12/05/2019  . Community acquired pneumonia 12/05/2019  . Essential hypertension 12/05/2019  . Acute respiratory failure ([place]) 12/05/2019   PCP:  Alvester Morin, MD Pharmacy:  No Pharmacies Listed    Social Determinants of Health (SDOH) Interventions    Readmission Risk Interventions No flowsheet data found.

## 2019-12-07 NOTE — NC FL2 (Signed)
[place] LEVEL OF CARE SCREENING TOOL     IDENTIFICATION  Patient Name: Chad Allen Birthdate: Feb 09, 1941 Sex: male Admission Date (Current Location): 12/04/2019  [place] and [place] Number:  Engineering geologist and Address:  [facility], [address], [place], [place] [address]      Provider Number: [id number]  Attending Physician Name and Address:  Ezekiel Slocumb, DO  Relative Name and Phone Number:       Current Level of Care: Hospital Recommended Level of Care: [place] Prior Approval Number:    Date Approved/Denied:   PASRR Number:    Discharge Plan: SNF    Current Diagnoses: Patient Active Problem List   Diagnosis Date Noted  . Hypokalemia 12/06/2019  . Hypomagnesemia 12/06/2019  . Mouth pain 12/06/2019  . Acute metabolic encephalopathy 123XX123  . Sepsis ([place]) 12/05/2019  . UTI (urinary tract infection) 12/05/2019  . Suspected COVID-19 virus infection 12/05/2019  . 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 Dementia without behavioral disturbance ([place]) 12/05/2019  . Acute on chronic respiratory failure with hypoxia ([place]) 12/05/2019  . Community acquired pneumonia 12/05/2019  . Essential hypertension 12/05/2019  . Acute respiratory failure ([place]) 12/05/2019    Orientation RESPIRATION BLADDER Height & Weight     Self, Place  O2(chronic O2 at 2L) Incontinent Weight: 81.6 kg Height:  6' (182.9 cm)  BEHAVIORAL SYMPTOMS/MOOD NEUROLOGICAL BOWEL NUTRITION STATUS      Incontinent Diet, Feeding tube(G tube)  AMBULATORY STATUS COMMUNICATION OF NEEDS Skin   Extensive Assist Verbally Bruising, Skin abrasions                       Personal Care Assistance Level of Assistance  Bathing, Feeding, Dressing Bathing Assistance: Maximum assistance Feeding assistance: Limited assistance Dressing Assistance: Maximum assistance     Functional Limitations Info             SPECIAL CARE FACTORS  FREQUENCY                       Contractures Contractures Info: Not present    Additional Factors Info  Code Status, Allergies Code Status Info: DNR Allergies Info: Fluoxetine, lisinopril           Current Medications (12/07/2019):  This is the current hospital active medication list Current Facility-Administered Medications  Medication Dose Route Frequency Provider Last Rate Last Admin  . 0.9 %  sodium chloride infusion   Intravenous Continuous Athena Masse, MD 100 mL/hr at 12/07/19 [id number] New Bag at 12/07/19 [id number]  . 0.9 %  sodium chloride infusion   Intravenous PRN Nicole Kindred A, DO   Stopped at 12/07/19 0058  . acetaminophen (TYLENOL) tablet 650 mg  650 mg Oral Q6H PRN Athena Masse, MD   650 mg at 12/05/19 1326   Or  . acetaminophen (TYLENOL) suppository 650 mg  650 mg Rectal Q6H PRN Athena Masse, MD      . ALPRAZolam Duanne Moron) tablet 0.5 mg  0.5 mg Oral Q8H PRN Sharion Settler, NP   0.5 mg at 12/06/19 0425  . amLODipine (NORVASC) tablet 10 mg  10 mg Oral Daily Nicole Kindred A, DO   10 mg at 12/07/19 0843  . atorvastatin (LIPITOR) tablet 20 mg  20 mg Oral QHS Sharion Settler, NP      . carbamazepine (TEGRETOL) chewable tablet 100 mg  100 mg Oral QID Sharion Settler, NP   100 mg at 12/07/19  0845  . carbamazepine (TEGRETOL) tablet 200 mg  200 mg Oral QID Sharion Settler, NP   200 mg at 12/07/19 0844  . ceFAZolin (ANCEF) IVPB 1 g/50 mL premix  1 g Intravenous Q8H Nicole Kindred A, DO 100 mL/hr at 12/07/19 0834 1 g at 12/07/19 0834  . donepezil (ARICEPT) tablet 10 mg  10 mg Oral QHS Nicole Kindred A, DO      . DULoxetine (CYMBALTA) DR capsule 30 mg  30 mg Oral Daily Sharion Settler, NP   30 mg at 12/07/19 0844  . enoxaparin (LOVENOX) injection 40 mg  40 mg Subcutaneous Q24H Judd Gaudier V, MD   40 mg at 12/07/19 0200  . gabapentin (NEURONTIN) 250 MG/5ML solution 200 mg  200 mg Oral Q8H Griffith, Kelly A, DO      . hydrALAZINE (APRESOLINE) injection 10 mg  10  mg Intravenous Q6H PRN Nicole Kindred A, DO   10 mg at 12/07/19 0511  . insulin aspart (novoLOG) injection 0-9 Units  0-9 Units Subcutaneous Q4H Athena Masse, MD   1 Units at 12/07/19 [phone]  . insulin aspart (novoLOG) injection 2 Units  2 Units Subcutaneous Q4H Judd Gaudier V, MD      . labetalol (NORMODYNE) injection 10 mg  10 mg Intravenous Q3H PRN Nicole Kindred A, DO      . lacosamide (VIMPAT) 150 mg in sodium chloride 0.9 % 25 mL IVPB  150 mg Intravenous Q12H Sharion Settler, NP   Stopped at 12/07/19 0240  . lidocaine (XYLOCAINE) 2 % viscous mouth solution 15 mL  15 mL Mouth/Throat Q4H PRN Nicole Kindred A, DO   15 mL at 12/07/19 0216  . losartan (COZAAR) tablet 25 mg  25 mg Oral Daily Nicole Kindred A, DO   25 mg at 12/07/19 0844  . morphine 2 MG/ML injection 2 mg  2 mg Intravenous Q4H PRN Nicole Kindred A, DO   2 mg at 12/07/19 0535  . QUEtiapine (SEROQUEL) tablet 25 mg  25 mg Oral QHS Sharion Settler, NP         Discharge Medications: Please see discharge summary for a list of discharge medications.  Relevant Imaging Results:  Relevant Lab Results:   Additional Information SS# [id number]  Shelbie Hutching, RN

## 2019-12-07 NOTE — Plan of Care (Signed)

## 2019-12-08 DIAGNOSIS — J9621 Acute and chronic respiratory failure with hypoxia: Secondary | ICD-10-CM

## 2019-12-08 LAB — BASIC METABOLIC PANEL
Anion gap: 14 (ref 5–15)
BUN: 11 mg/dL (ref 8–23)
CO2: 25 mmol/L (ref 22–32)
Calcium: 9.2 mg/dL (ref 8.9–10.3)
Chloride: 103 mmol/L (ref 98–111)
Creatinine, Ser: 0.74 mg/dL (ref 0.61–1.24)
GFR calc Af Amer: >60 mL/min (ref >60)
GFR calc non Af Amer: >60 mL/min (ref >60)
Glucose, Bld: 137 mg/dL — ABNORMAL HIGH (ref 70–99)
Potassium: 3.3 mmol/L — ABNORMAL LOW (ref 3.5–5.1)
Sodium: 142 mmol/L (ref 135–145)

## 2019-12-08 LAB — GLUCOSE, CAPILLARY
Glucose-Capillary: 100 mg/dL — ABNORMAL HIGH (ref 70–99)
Glucose-Capillary: 120 mg/dL — ABNORMAL HIGH (ref 70–99)
Glucose-Capillary: 130 mg/dL — ABNORMAL HIGH (ref 70–99)
Glucose-Capillary: 157 mg/dL — ABNORMAL HIGH (ref 70–99)
Glucose-Capillary: 90 mg/dL (ref 70–99)

## 2019-12-08 LAB — PHOSPHORUS: Phosphorus: 2.7 mg/dL (ref 2.5–4.6)

## 2019-12-08 LAB — MAGNESIUM: Magnesium: 2.1 mg/dL (ref 1.7–2.4)

## 2019-12-08 MED ORDER — POTASSIUM CHLORIDE 20 MEQ PO PACK
40.0000 meq | PACK | Freq: Once | ORAL | Status: AC
  Administered 2019-12-08: 09:00:00 40 meq
  Filled 2019-12-08: qty 2

## 2019-12-08 NOTE — Progress Notes (Signed)
Nsg reports Pt is tolerating current pureed diet and meds as well. Will continue to offer dysphagia 1 with thin liquids along with tube feedings. Trigeminal neuralgia flare continues to improve. rec Po diet of puree with thin liquids at discharge.

## 2019-12-08 NOTE — Plan of Care (Signed)
Pt confused, alert to self and place.  Pt reported mouth pain , but declined pain med. Speech is more clear today. Tongue appears less swollen.  Pt ate some today and took meds crushed with apple sauce by mouth. Tube feeding continues. Pt tolerates tube feeding.

## 2019-12-08 NOTE — Progress Notes (Signed)
PROGRESS NOTE    Chad Allen  [id number] DOB: 1941/07/21 DOA: 12/04/2019  PCP: Alvester Morin, MD    LOS - 3   Brief Narrative:  78 y.o.malenursing home resident with a history of dementia, gastrostomy tube, on chronic home O2, non-insulin-dependent type 2 diabetes and hypertension,sent to theEDafter he was found with decreased responsiveness, drooling on himself and appearing to be short of breath.Arrival of EMS they recorded an O2 sat of 84% on room air that improved to 97% on nonrebreather. By arrival in the emergency room O2 sats in the high 90s via nonrebreather. He was afebrile but pulse was 114 and respirations 25. After short time in the emergency room he became more responsive and was transitioned to O2 by nasal cannula at 4 L.ABG showed a pH of 7.46, PCO2 of 46 and PO2 of 106.Labs notable for WBC12k, mild hypokalemiapotassium of 3.3,elevated inflammatory markers including ferritin 82 triglycerides 120 LDH 151 and elevated D-dimer. Procalcitonin was up at 0.37.UAconsistent with UTI. Patient was treated persepsis protocol,with IV fluid resuscitation and antibiotics. CTA chest obtained and ruled out PE. COVID-19 negative. He was initially treated for possible CA-PNA and UTI. Patient remained free of respiratory symptoms since initial presentation, CT personally reviewed and does not appear clinically or by imaging to have pneumonia. Urine culture growing Klebsiella pneumoniae, antibiotics de-escalated to cefazolin.  Patient has history of trigeminal neuralgia, has been flared during admission, likely due to being NPO and not getting Tegretol.  Patient has associated difficulty talking and swallowing due to pain.  Medications resumed by his G tube.  This has improved with resuming Tegretol.  Subjective 12/19: Patient awake in bed when seen this AM.  He reports mouth pain still bothering him but now 7/10, so less severe.  RN informed me patient  was able to tolerate meds in applesauce today which is an improvement as well.  He denies fever/chills, SOB, chest pain, N/V/D or other acute complaints.  No acute events reported.  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 -continue cefazolin, to stop 12/19  Hypokalemia - repleting Hypomagnesemia - repleting - monitor and replete for goal K>4.0, [id number]  Acute metabolic encephalopathy-resolved Suspect this was primarily due to infection/sepsis. -Maintain fall precautions -Aspiration precautions  Acute on chronic respiratory failure with hypoxia-resolved Patienton baseline 2 L/min O2. Suspect patient's acute hypoxia was due to his altered mental status and likely poor/shallow inspirations at that time, as it resolved once patient more responsive and alert.  Has not recurred.  Community-acquired pneumonia-ruled out Stopped antibiotics for this.Clinically, patient does not have respiratory symptoms and imaging did not show infiltrates.  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 Resume home Aricept.   DVT  prophylaxis: Lovenox   Code Status: DNR  Family Communication: none at bedside  Disposition Plan:  [facility] to [facility], likely medically ready in 1-2 days.  Will need to ensure SNF can do tube feeds to support nutrition, and convert to bolus feeds instead of continuous.   Consultants:   SLP  Dietician  Procedures:   None  Antimicrobials:   Cefazolin, start 12/17, stop 12/19  Rocephin and Doxycycline, start 12/15, stopped 12/17     Objective: Vitals:   12/07/19 2036 12/08/19 0321 12/08/19 0518 12/08/19 0925  BP: (!) 152/81  (!) 148/70 (!) 157/82  Pulse: (!) 103 94 96 99  Resp: 17  18 18   Temp: 98.1 F (36.7 C)  97.9 F (36.6 C) 98.2 F (36.8 C)  TempSrc:   Oral Oral  SpO2: 91%  93% 92%  Weight:      Height:        Intake/Output Summary (Last 24 hours) at 12/08/2019 1139 Last data filed at 12/08/2019 0900 Gross per 24 hour  Intake 1337.23 ml  Output 1400 ml  Net -62.77 ml   Filed Weights   12/04/19 2319 12/07/19 1141  Weight: 81.6 kg 83.7 kg    Examination:  General exam: awake, alert, no acute distress, underweight HEENT: moist mucus membranes, edentulous Respiratory system: clear to auscultation bilaterally, no wheezes, rales or rhonchi, normal respiratory effort. Cardiovascular system: normal S1/S2, RRR, no pedal edema.   Gastrointestinal system: soft, non-tender, non-distended abdomen, G tube in place Central nervous system: alert and oriented x3. no gross focal neurologic deficits, normal speech Skin: dry, intact, normal temperature Psychiatry: normal mood, congruent affect    Data Reviewed: I have personally reviewed following labs and imaging studies  CBC: Recent Labs  Lab 12/04/19 2342 12/06/19 0626  WBC 12.2* 7.8  NEUTROABS 10.1* 5.6  HGB 11.4* 9.3*  HCT 36.7* 28.6*  MCV 87.4 84.4  PLT 387 [id number]   Basic Metabolic Panel: Recent Labs  Lab 12/04/19 2342 12/06/19 0626 12/07/19 0834 12/08/19 0514  NA 142 145 142 142  K 3.3* 3.2*  3.5 3.3*  CL 98 108 106 103  CO2 25 27 20* 25  GLUCOSE 130* 99 112* 137*  BUN 18 14 11 11   CREATININE 1.12 0.74 0.88 0.74  CALCIUM 8.9 8.5* 8.9 9.2  MG  --  1.6* 1.7 2.1  PHOS  --   --   --  2.7   GFR: Estimated Creatinine Clearance: 86.2 mL/min (by C-G formula based on SCr of 0.74 mg/dL). Liver Function Tests: Recent Labs  Lab 12/04/19 2342  AST 12*  ALT 8  ALKPHOS 113  BILITOT 0.9  PROT 8.5*  ALBUMIN 3.4*   No results for input(s): LIPASE, AMYLASE in the last 168 hours. No results for input(s): AMMONIA in the last 168 hours. Coagulation Profile: No results for input(s): INR, PROTIME in the last 168 hours. Cardiac Enzymes: No results for input(s): CKTOTAL, CKMB, CKMBINDEX, TROPONINI in the last 168 hours. BNP (last 3 results) No results for input(s): PROBNP in the last 8760 hours. HbA1C: No results for input(s): HGBA1C in the last 72 hours. CBG: Recent Labs  Lab 12/07/19 1127 12/07/19 1656 12/07/19 2125 12/08/19 0003 12/08/19 0751  GLUCAP 100* 102* 118* 120* 130*   Lipid Profile: No results for input(s): CHOL, HDL, LDLCALC, TRIG, CHOLHDL, LDLDIRECT in the last 72 hours. Thyroid Function Tests: No results for input(s): TSH, T4TOTAL, FREET4, T3FREE, THYROIDAB in the last 72 hours. Anemia Panel: No results for input(s): VITAMINB12, FOLATE,  FERRITIN, TIBC, IRON, RETICCTPCT in the last 72 hours. Sepsis Labs: Recent Labs  Lab 12/04/19 2342 12/05/19 0058  PROCALCITON 0.39  --   LATICACIDVEN  --  1.3    Recent Results (from the past 240 hour(s))  Blood Culture (routine x 2)     Status: None (Preliminary result)   Collection Time: 12/04/19 11:57 PM   Specimen: BLOOD  Result Value Ref Range Status   Specimen Description BLOOD BLOOD LEFT HAND  Final   Special Requests   Final    BOTTLES DRAWN AEROBIC AND ANAEROBIC Blood Culture adequate volume   Culture   Final    NO GROWTH 3 DAYS Performed at [facility], [address], [place], [place]  [address]    Report Status PENDING  Incomplete  Blood Culture (routine x 2)     Status: None (Preliminary result)   Collection Time: 12/04/19 11:58 PM   Specimen: BLOOD  Result Value Ref Range Status   Specimen Description BLOOD RIGHT ANTECUBITAL  Final   Special Requests   Final    BOTTLES DRAWN AEROBIC AND ANAEROBIC Blood Culture adequate volume   Culture   Final    NO GROWTH 3 DAYS Performed at [facility], [address], [place], [place] [address]    Report Status PENDING  Incomplete  Urine culture     Status: Abnormal   Collection Time: 12/04/19 11:58 PM   Specimen: Urine, Catheterized  Result Value Ref Range Status   Specimen Description   Final    URINE, CATHETERIZED Performed at [facility], [address] [place], [place] [address]    Special Requests   Final    Normal Performed at [facility], [address] [place], [place] [address]    Culture (A)  Final    >=100,000 COLONIES/mL KLEBSIELLA PNEUMONIAE Two isolates with different morphologies were identified as the same organism.The most resistant organism was reported. Performed at [facility], [place] [address], [place], [place] [address]    Report Status 12/06/2019 FINAL  Final   Organism ID, Bacteria KLEBSIELLA PNEUMONIAE (A)  Final      Susceptibility   Klebsiella pneumoniae - MIC*    AMPICILLIN >=32 RESISTANT Resistant     CEFAZOLIN <=4 SENSITIVE Sensitive     CEFTRIAXONE <=1 SENSITIVE Sensitive     CIPROFLOXACIN <=0.25 SENSITIVE Sensitive     GENTAMICIN <=1 SENSITIVE Sensitive     IMIPENEM <=0.25 SENSITIVE Sensitive     NITROFURANTOIN 128 RESISTANT Resistant     TRIMETH/SULFA <=20 SENSITIVE Sensitive     AMPICILLIN/SULBACTAM 8 SENSITIVE Sensitive     PIP/TAZO <=4 SENSITIVE Sensitive     * >=100,000 COLONIES/mL KLEBSIELLA PNEUMONIAE  SARS CORONAVIRUS 2 (TAT 6-24 HRS) Nasopharyngeal Nasopharyngeal Swab     Status: None   Collection Time: 12/05/19  1:37 AM   Specimen:  Nasopharyngeal Swab  Result Value Ref Range Status   SARS Coronavirus 2 NEGATIVE NEGATIVE Final    Comment: (NOTE) SARS-CoV-2 target nucleic acids are NOT DETECTED. The SARS-CoV-2 RNA is generally detectable in upper and lower respiratory specimens during the acute phase of infection. Negative results do not preclude SARS-CoV-2 infection, do not rule out co-infections with other pathogens, and should not be used as the sole basis for treatment or other patient management decisions. Negative results must be combined with clinical observations, patient history, and epidemiological information. The expected result is Negative. Fact Sheet for Patients: SugarRoll.be Fact Sheet for Healthcare Providers: https://www.woods-mathews.com/ This test  is not yet approved or cleared by the [place] and  has been authorized for detection and/or diagnosis of SARS-CoV-2 by FDA under an Emergency Use Authorization (EUA). This EUA will remain  in effect (meaning this test can be used) for the duration of the COVID-19 declaration under Section 56 4(b)(1) of the Act, 21 U.S.C. section 360bbb-3(b)(1), unless the authorization is terminated or revoked sooner. Performed at [facility], [place] [address], [place], [place] [address]   MRSA PCR Screening     Status: None   Collection Time: 12/06/19  5:30 PM   Specimen: Nasal Mucosa; Nasopharyngeal  Result Value Ref Range Status   MRSA by PCR NEGATIVE NEGATIVE Final    Comment:        The GeneXpert MRSA Assay (FDA approved for NASAL specimens only), is one component of a comprehensive MRSA colonization surveillance program. It is not intended to diagnose MRSA infection nor to guide or monitor treatment for MRSA infections. Performed at [facility], [address] [place], [place] [address]          Radiology Studies: DG Abd 1 View  Result Date: 12/07/2019 CLINICAL DATA:   Gastrostomy tube.  Diabetes.  Hypertension. EXAM: ABDOMEN - 1 VIEW COMPARISON:  12/05/2019. FINDINGS: Gastrostomy tube noted in unchanged position looped in stomach with tip over the pyloric region. No gastric or bowel distention. No free air. Degenerative change lumbar spine and both hips. Prior lumbar spine fusion. Surgical clips in the pelvis. Penile prosthesis noted. Aortoiliac atherosclerotic vascular calcification. IMPRESSION: Gastrostomy tube in unchanged position looped in stomach with tip over the pyloric region. No gastric or bowel distention. Exam is stable from prior exam. Electronically Signed   By: [place]   On: 12/07/2019 07:21        Scheduled Meds: . amLODipine  10 mg Oral Daily  . atorvastatin  20 mg Oral QHS  . carbamazepine  100 mg Oral QID  . carbamazepine  200 mg Oral QID  . donepezil  10 mg Oral QHS  . DULoxetine  30 mg Oral Daily  . enoxaparin (LOVENOX) injection  40 mg Subcutaneous Q24H  . free water  150 mL Per Tube Q4H  . gabapentin  200 mg Oral Q8H  . insulin aspart  0-9 Units Subcutaneous Q4H  . insulin aspart  2 Units Subcutaneous Q4H  . losartan  25 mg Oral Daily  . QUEtiapine  25 mg Oral QHS   Continuous Infusions: . sodium chloride Stopped (12/07/19 2237)  .  ceFAZolin (ANCEF) IV 1 g (12/08/19 0453)  . feeding supplement (JEVITY 1.5 CAL/FIBER) 40 mL/hr at 12/08/19 0725  . lacosamide (VIMPAT) IV Stopped (12/07/19 2307)     LOS: 3 days    Time spent: 25-30 minutes    Ezekiel Slocumb, DO [facility] Pager: [phone]  If 7PM-7AM, please contact night-coverage [email or web address] Password [email or web address] 12/08/2019, 11:39 AM

## 2019-12-09 LAB — BASIC METABOLIC PANEL
Anion gap: 8 (ref 5–15)
BUN: 16 mg/dL (ref 8–23)
CO2: 28 mmol/L (ref 22–32)
Calcium: 8.6 mg/dL — ABNORMAL LOW (ref 8.9–10.3)
Chloride: 103 mmol/L (ref 98–111)
Creatinine, Ser: 0.99 mg/dL (ref 0.61–1.24)
GFR calc Af Amer: >60 mL/min (ref >60)
GFR calc non Af Amer: >60 mL/min (ref >60)
Glucose, Bld: 110 mg/dL — ABNORMAL HIGH (ref 70–99)
Potassium: 3.7 mmol/L (ref 3.5–5.1)
Sodium: 139 mmol/L (ref 135–145)

## 2019-12-09 LAB — GLUCOSE, CAPILLARY
Glucose-Capillary: 100 mg/dL — ABNORMAL HIGH (ref 70–99)
Glucose-Capillary: 111 mg/dL — ABNORMAL HIGH (ref 70–99)
Glucose-Capillary: 112 mg/dL — ABNORMAL HIGH (ref 70–99)
Glucose-Capillary: 120 mg/dL — ABNORMAL HIGH (ref 70–99)
Glucose-Capillary: 121 mg/dL — ABNORMAL HIGH (ref 70–99)
Glucose-Capillary: 143 mg/dL — ABNORMAL HIGH (ref 70–99)

## 2019-12-09 MED ORDER — LOSARTAN POTASSIUM 50 MG PO TABS: 50.0000 mg | ORAL_TABLET | Freq: Every day | ORAL | Status: DC

## 2019-12-09 MED ORDER — LACOSAMIDE 50 MG PO TABS: 150.0000 mg | ORAL_TABLET | Freq: Two times a day (BID) | ORAL | Status: DC

## 2019-12-09 MED ORDER — INSULIN ASPART 100 UNIT/ML ~~LOC~~ SOLN: 2.0000 [IU] | SUBCUTANEOUS | 11 refills | Status: DC

## 2019-12-09 MED ORDER — NYSTATIN 100000 UNIT/ML MT SUSP
5.0000 mL | Freq: Four times a day (QID) | OROMUCOSAL | Status: DC
  Administered 2019-12-09: 500000 [IU] via ORAL
  Filled 2019-12-09: qty 5

## 2019-12-09 MED ORDER — INSULIN ASPART 100 UNIT/ML ~~LOC~~ SOLN: 0.0000 [IU] | SUBCUTANEOUS | 11 refills | Status: AC

## 2019-12-09 MED ORDER — JEVITY 1.5 CAL/FIBER PO LIQD: 1000.0000 mL | ORAL | Status: DC

## 2019-12-09 MED ORDER — LOSARTAN POTASSIUM 50 MG PO TABS: 50.0000 mg | ORAL_TABLET | Freq: Every day | ORAL | 0 refills | Status: DC

## 2019-12-09 MED ORDER — GABAPENTIN 100 MG PO CAPS: 200.0000 mg | ORAL_CAPSULE | Freq: Two times a day (BID) | ORAL | 2 refills | Status: AC

## 2019-12-09 MED ORDER — FREE WATER: 150.0000 mL | Status: AC

## 2019-12-09 MED ORDER — NYSTATIN 100000 UNIT/ML MT SUSP: 5.0000 mL | Freq: Four times a day (QID) | OROMUCOSAL | 0 refills | Status: DC

## 2019-12-09 MED ORDER — AMLODIPINE BESYLATE 10 MG PO TABS: 10.0000 mg | ORAL_TABLET | Freq: Every day | ORAL | 0 refills | Status: AC

## 2019-12-09 NOTE — NC FL2 (Signed)
[place] LEVEL OF CARE SCREENING TOOL     IDENTIFICATION  Patient Name: Chad Allen Birthdate: 11-Mar-1941 Sex: male Admission Date (Current Location): 12/04/2019  [place] and [place] Number:  Engineering geologist and Address:  [facility], [address], [place], [place] [address]      Provider Number: [id number]  Attending Physician Name and Address:  Ezekiel Slocumb, DO  Relative Name and Phone Number:       Current Level of Care: Hospital Recommended Level of Care: [place] Prior Approval Number:    Date Approved/Denied:   PASRR Number:    Discharge Plan: SNF    Current Diagnoses: Patient Active Problem List   Diagnosis Date Noted  . Hypokalemia 12/06/2019  . Hypomagnesemia 12/06/2019  . Mouth pain 12/06/2019  . Acute metabolic encephalopathy [id number]  . Sepsis ([place]) 12/05/2019  . UTI (urinary tract infection) 12/05/2019  . 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 Dementia without behavioral disturbance ([place]) 12/05/2019  . Acute on chronic respiratory failure with hypoxia ([place]) 12/05/2019  . Essential hypertension 12/05/2019    Orientation RESPIRATION BLADDER Height & Weight     Self, Place  O2(chronic O2 at 2L) Incontinent Weight: 184 lb 8 oz (83.7 kg) Height:  6' (182.9 cm)  BEHAVIORAL SYMPTOMS/MOOD NEUROLOGICAL BOWEL NUTRITION STATUS      Incontinent Diet, Feeding tube(G tube)  AMBULATORY STATUS COMMUNICATION OF NEEDS Skin   Extensive Assist Verbally Bruising, Skin abrasions                       Personal Care Assistance Level of Assistance  Bathing, Feeding, Dressing Bathing Assistance: Maximum assistance Feeding assistance: Limited assistance Dressing Assistance: Maximum assistance     Functional Limitations Info             SPECIAL CARE FACTORS FREQUENCY                       Contractures Contractures Info: Not present    Additional Factors Info  Code  Status, Allergies Code Status Info: DNR Allergies Info: Fluoxetine, lisinopril           Current Medications (12/09/2019):  This is the current hospital active medication list Current Facility-Administered Medications  Medication Dose Route Frequency Provider Last Rate Last Admin  . 0.9 %  sodium chloride infusion   Intravenous PRN Nicole Kindred A, DO   Stopped at 12/09/19 1226  . acetaminophen (TYLENOL) tablet 650 mg  650 mg Oral Q6H PRN Athena Masse, MD   650 mg at 12/05/19 1326   Or  . acetaminophen (TYLENOL) suppository 650 mg  650 mg Rectal Q6H PRN Athena Masse, MD      . ALPRAZolam Duanne Moron) tablet 0.5 mg  0.5 mg Oral Q8H PRN Sharion Settler, NP   0.5 mg at 12/07/19 2148  . amLODipine (NORVASC) tablet 10 mg  10 mg Oral Daily Nicole Kindred A, DO   10 mg at 12/09/19 0843  . atorvastatin (LIPITOR) tablet 20 mg  20 mg Oral QHS Sharion Settler, NP   20 mg at 12/08/19 2212  . carbamazepine (TEGRETOL) chewable tablet 100 mg  100 mg Oral QID Sharion Settler, NP   100 mg at 12/09/19 1453  . carbamazepine (TEGRETOL) tablet 200 mg  200 mg Oral QID Sharion Settler, NP   200 mg at 12/09/19 1453  . donepezil (ARICEPT) tablet 10 mg  10 mg Oral QHS Nicole Kindred A, DO  10 mg at 12/08/19 2212  . DULoxetine (CYMBALTA) DR capsule 30 mg  30 mg Oral Daily Sharion Settler, NP   30 mg at 12/09/19 0843  . enoxaparin (LOVENOX) injection 40 mg  40 mg Subcutaneous Q24H Athena Masse, MD   40 mg at 12/09/19 0836  . feeding supplement (JEVITY 1.5 CAL/FIBER) liquid 1,000 mL  1,000 mL Per Tube Continuous Nicole Kindred A, DO 60 mL/hr at 12/09/19 0844 Rate Change at 12/09/19 0844  . free water 150 mL  150 mL Per Tube Q4H Nicole Kindred A, DO   150 mL at 12/09/19 1200  . gabapentin (NEURONTIN) 250 MG/5ML solution 200 mg  200 mg Oral Q8H Griffith, Kelly A, DO   200 mg at 12/09/19 1453  . hydrALAZINE (APRESOLINE) injection 10 mg  10 mg Intravenous Q6H PRN Nicole Kindred A, DO   10 mg at 12/07/19  0511  . insulin aspart (novoLOG) injection 0-9 Units  0-9 Units Subcutaneous Q4H Athena Masse, MD   1 Units at 12/09/19 1221  . insulin aspart (novoLOG) injection 2 Units  2 Units Subcutaneous Q4H Athena Masse, MD   2 Units at 12/09/19 1221  . lacosamide (VIMPAT) tablet 150 mg  150 mg Oral BID Nicole Kindred A, DO      . lidocaine (XYLOCAINE) 2 % viscous mouth solution 15 mL  15 mL Mouth/Throat Q4H PRN Nicole Kindred A, DO   15 mL at 12/07/19 0216  . [START ON 12/10/2019] losartan (COZAAR) tablet 50 mg  50 mg Oral Daily Nicole Kindred A, DO      . metoprolol tartrate (LOPRESSOR) injection 5 mg  5 mg Intravenous Q4H PRN Nicole Kindred A, DO      . morphine 2 MG/ML injection 2 mg  2 mg Intravenous Q4H PRN Nicole Kindred A, DO   2 mg at 12/08/19 0313  . nystatin (MYCOSTATIN) 100000 UNIT/ML suspension 500,000 Units  5 mL Oral QID Nicole Kindred A, DO      . QUEtiapine (SEROQUEL) tablet 25 mg  25 mg Oral QHS Sharion Settler, NP   25 mg at 12/08/19 2212     Discharge Medications: Please see discharge summary for a list of discharge medications.  Relevant Imaging Results:  Relevant Lab Results:   Additional Information SS# [id number]  Truitt Merle, LCSW

## 2019-12-09 NOTE — Plan of Care (Signed)
EMS just picked up patient to transport him back to [facility].  Pt is confused - doesn't understand he's at the hospital.  He was admitted for sepsis r/t UTI.  Day nurse called report to [facility].  Pt's FBS was 120 before transport. VS WNL.

## 2019-12-09 NOTE — Progress Notes (Signed)
PROGRESS NOTE    Chad Allen  [id number] DOB: November 17, 1941 DOA: 12/04/2019  PCP: Alvester Morin, MD    LOS - 4   Brief Narrative:  78 y.o.malenursing home resident with a history of dementia, gastrostomy tube, on chronic home O2, non-insulin-dependent type 2 diabetes and hypertension,sent to theEDafter he was found with decreased responsiveness, drooling on himself and appearing to be short of breath.Arrival of EMS they recorded an O2 sat of 84% on room air that improved to 97% on nonrebreather. By arrival in the emergency room O2 sats in the high 90s via nonrebreather. He was afebrile but pulse was 114 and respirations 25. After short time in the emergency room he became more responsive and was transitioned to O2 by nasal cannula at 4 L.ABG showed a pH of 7.46, PCO2 of 46 and PO2 of 106.Labs notable for WBC12k, mild hypokalemiapotassium of 3.3,elevated inflammatory markers including ferritin 82 triglycerides 120 LDH 151 and elevated D-dimer. Procalcitonin was up at 0.37.UAconsistent with UTI. Patient was treated persepsis protocol,with IV fluid resuscitation and antibiotics. CTA chest obtained and ruled out PE. COVID-19 negative. He was initially treated for possible CA-PNA and UTI. Patient remained free of respiratory symptoms since initial presentation, CT personally reviewed and does not appear clinically or by imaging to have pneumonia. Urine culture growing Klebsiella pneumoniae, antibiotics de-escalated to cefazolin.  Patient has history of trigeminal neuralgia, has been flared during admission, likely due to being NPO and not getting Tegretol. Patient has associated difficulty talking and swallowing due to pain. Medications resumed by his G tube.  Tube feedings also started to support nutrition as patient has lost 36 pounds since G-J tube placed in March and only oral diet for past 3 months.  Tube was placed at [facility] due to severe TN pain he was  unable to tolerate eating.  Pain has been improving with resuming Tegretol, now tolerating some oral intake, PO meds in applesauce.     Subjective 12/20: Patient sleeping comfortably this morning when seen but awoke to voice.  He continues to report mouth pain, but says it is a little better today than yesterday.  Has been able to tolerate his meds in applesauce and some food as well.  He forgets that he is receiving continuous tube feeds and says he does not do tube feeding anymore.  Denies fevers or chills, chest pain or shortness of breath.  No acute events reported.  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 -Completed antibiotics  Hypokalemia - repleting Hypomagnesemia - repleting - monitor and replete for goal K>4.0, [id number]  Acute metabolic encephalopathy-resolved Suspect this was primarily due to infection/sepsis. -Maintain fall precautions -Aspiration precautions  Acute on chronic respiratory failure with hypoxia-resolved Patienton baseline 2 L/min O2. Suspect patient's acute hypoxia was due to his altered mental status and likely poor/shallow  inspirations at that time, as it resolved once patient more responsive and alert.Has not  recurred.  Community-acquired pneumonia-ruled out Stopped antibioticsfor this.Clinically, patient does not have respiratory symptoms and imaging did not show infiltrates.  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 Resume home Aricept.   DVT prophylaxis: Lovenox   Code Status: DNR  Family Communication: None at bedside Disposition Plan: Return to [facility], need to ensure SNF can do tube feeds as patient needs these to supplement his nutrition.  Otherwise medically ready.   Consultants:   SLP  Dietitian  Procedures: Include things that cannot be auto populated i.e. Echo, Carotid and venous dopplers, Foley, Bipap, HD, tubes/drains, wound vac, central lines etc)  None  Antimicrobials:   Cefazolin, start 12/17, stop 12/19  Rocephin and Doxycycline, start 12/15, stopped 12/17    Objective: Vitals:   12/08/19 0925 12/08/19 1443 12/08/19 1934 12/09/19 0533  BP: (!) 157/82 (!) 146/82 139/78 (!) 150/78  Pulse: 99 (!) 108 89 85  Resp: 18 18 16 18   Temp: 98.2 F (36.8 C)  97.9 F (36.6 C) 97.9 F (36.6 C)  TempSrc: Oral   Oral  SpO2: 92% 97% 99% 98%  Weight:      Height:        Intake/Output Summary (Last 24 hours) at 12/09/2019 0748 Last data filed at 12/09/2019 0700 Gross per 24 hour  Intake 1047.68 ml  Output 800 ml  Net 247.68 ml   Filed Weights   12/04/19 2319 12/07/19 1141  Weight: 81.6 kg 83.7 kg    Examination:  General exam: awake, alert, no acute distress Respiratory system: clear to auscultation bilaterally, no wheezes, rales or rhonchi, normal respiratory effort. Cardiovascular system: normal S1/S2, RRR, no JVD, murmurs, rubs, gallops, no pedal edema.    Gastrointestinal system: soft, non-tender, non-distended abdomen, G tube present Central nervous system: alert and oriented x3. no gross focal neurologic deficits, normal speech Extremities: moves all, no edema, normal tone Psychiatry: normal mood, congruent affect, judgement and insight appear normal    Data Reviewed: I have personally reviewed following labs and imaging studies  CBC: Recent Labs  Lab 12/04/19 2342 12/06/19 0626  WBC 12.2* 7.8  NEUTROABS 10.1* 5.6  HGB 11.4* 9.3*  HCT 36.7* 28.6*  MCV 87.4 84.4  PLT 387 [id number]   Basic Metabolic Panel: Recent Labs  Lab 12/04/19 2342 12/06/19 0626 12/07/19 0834 12/08/19 0514 12/09/19 0558  NA 142 145 142 142 139  K 3.3* 3.2* 3.5 3.3* 3.7  CL 98 108 106 103 103  CO2 25 27 20* 25 28  GLUCOSE 130* 99 112* 137* 110*  BUN 18 14 11 11 16   CREATININE 1.12 0.74 0.88 0.74 0.99  CALCIUM 8.9 8.5* 8.9 9.2 8.6*  MG  --  1.6* 1.7 2.1  --   PHOS  --   --   --  2.7  --    GFR: Estimated Creatinine Clearance: 69.7 mL/min (by C-G formula based on SCr of 0.99 mg/dL). Liver Function Tests: Recent Labs  Lab 12/04/19 2342  AST 12*  ALT 8  ALKPHOS 113  BILITOT 0.9  PROT 8.5*  ALBUMIN 3.4*   No results for input(s): LIPASE, AMYLASE in the last 168 hours. No results for input(s): AMMONIA in the last 168 hours. Coagulation  Profile: No results for input(s): INR, PROTIME in the last 168 hours. Cardiac Enzymes: No results for input(s): CKTOTAL, CKMB, CKMBINDEX, TROPONINI in the last 168 hours. BNP (last 3 results) No results for input(s): PROBNP in the last 8760 hours. HbA1C: No results for input(s): HGBA1C in the last 72 hours. CBG: Recent Labs  Lab 12/08/19 1138 12/08/19 1633 12/08/19 2129 12/09/19 0003 12/09/19 0423  GLUCAP 90 157* 100* 121* 111*   Lipid Profile: No results for input(s): CHOL, HDL, LDLCALC, TRIG, CHOLHDL, LDLDIRECT in the last 72 hours. Thyroid Function Tests: No results for input(s): TSH, T4TOTAL,  FREET4, T3FREE, THYROIDAB in the last 72 hours. Anemia Panel: No results for input(s): VITAMINB12, FOLATE, FERRITIN, TIBC, IRON, RETICCTPCT in the last 72 hours. Sepsis Labs: Recent Labs  Lab 12/04/19 2342 12/05/19 0058  PROCALCITON 0.39  --   LATICACIDVEN  --  1.3    Recent Results (from the past 240 hour(s))  Blood Culture (routine x 2)     Status: None (Preliminary result)   Collection Time: 12/04/19 11:57 PM   Specimen: BLOOD  Result Value Ref Range Status   Specimen Description BLOOD BLOOD LEFT HAND  Final   Special Requests   Final    BOTTLES DRAWN AEROBIC AND ANAEROBIC Blood Culture adequate volume   Culture   Final    NO GROWTH 4 DAYS Performed at [facility], [address], [place], [place] [address]    Report Status PENDING  Incomplete  Blood Culture (routine x 2)     Status: None (Preliminary result)   Collection Time: 12/04/19 11:58 PM   Specimen: BLOOD  Result Value Ref Range Status   Specimen Description BLOOD RIGHT ANTECUBITAL  Final   Special Requests   Final    BOTTLES DRAWN AEROBIC AND ANAEROBIC Blood Culture adequate volume   Culture   Final    NO GROWTH 4 DAYS Performed at [facility], [address], [place], [place] [address]    Report Status PENDING  Incomplete  Urine culture     Status: Abnormal   Collection Time: 12/04/19 11:58 PM   Specimen: Urine, Catheterized  Result Value Ref Range Status   Specimen Description   Final    URINE, CATHETERIZED Performed at [facility], [place], [address], [place], [place] [address]    Special Requests   Final    Normal Performed at [facility], [address] [place], [place] [address]    Culture (A)  Final    >=100,000 COLONIES/mL KLEBSIELLA PNEUMONIAE Two isolates with different morphologies were identified as the same organism.The most resistant organism was reported. Performed at [facility], [place] [address], [place], [place] [address]    Report Status  12/06/2019 FINAL  Final   Organism ID, Bacteria KLEBSIELLA PNEUMONIAE (A)  Final      Susceptibility   Klebsiella pneumoniae - MIC*    AMPICILLIN >=32 RESISTANT Resistant     CEFAZOLIN <=4 SENSITIVE Sensitive     CEFTRIAXONE <=1 SENSITIVE Sensitive     CIPROFLOXACIN <=0.25 SENSITIVE Sensitive     GENTAMICIN <=1 SENSITIVE Sensitive     IMIPENEM <=0.25 SENSITIVE Sensitive     NITROFURANTOIN 128 RESISTANT Resistant     TRIMETH/SULFA <=20 SENSITIVE Sensitive     AMPICILLIN/SULBACTAM 8 SENSITIVE Sensitive     PIP/TAZO <=4 SENSITIVE Sensitive     * >=100,000 COLONIES/mL KLEBSIELLA PNEUMONIAE  SARS CORONAVIRUS 2 (TAT 6-24 HRS) Nasopharyngeal Nasopharyngeal Swab     Status: None  Collection Time: 12/05/19  1:37 AM   Specimen: Nasopharyngeal Swab  Result Value Ref Range Status   SARS Coronavirus 2 NEGATIVE NEGATIVE Final    Comment: (NOTE) SARS-CoV-2 target nucleic acids are NOT DETECTED. The SARS-CoV-2 RNA is generally detectable in upper and lower respiratory specimens during the acute phase of infection. Negative results do not preclude SARS-CoV-2 infection, do not rule out co-infections with other pathogens, and should not be used as the sole basis for treatment or other patient management decisions. Negative results must be combined with clinical observations, patient history, and epidemiological information. The expected result is Negative. Fact Sheet for Patients: SugarRoll.be Fact Sheet for Healthcare Providers: https://www.woods-mathews.com/ This test is not yet approved or cleared by the [place] FDA and  has been authorized for detection and/or diagnosis of SARS-CoV-2 by FDA under an Emergency Use Authorization (EUA). This EUA will remain  in effect (meaning this test can be used) for the duration of the COVID-19 declaration under Section 56 4(b)(1) of the Act, 21 U.S.C. section 360bbb-3(b)(1), unless the authorization is  terminated or revoked sooner. Performed at [facility], [place] [address], [place], [place] [address]   MRSA PCR Screening     Status: None   Collection Time: 12/06/19  5:30 PM   Specimen: Nasal Mucosa; Nasopharyngeal  Result Value Ref Range Status   MRSA by PCR NEGATIVE NEGATIVE Final    Comment:        The GeneXpert MRSA Assay (FDA approved for NASAL specimens only), is one component of a comprehensive MRSA colonization surveillance program. It is not intended to diagnose MRSA infection nor to guide or monitor treatment for MRSA infections. Performed at [facility], [address], [place], [place] [address]          Radiology Studies: No results found.      Scheduled Meds: . amLODipine  10 mg Oral Daily  . atorvastatin  20 mg Oral QHS  . carbamazepine  100 mg Oral QID  . carbamazepine  200 mg Oral QID  . donepezil  10 mg Oral QHS  . DULoxetine  30 mg Oral Daily  . enoxaparin (LOVENOX) injection  40 mg Subcutaneous Q24H  . free water  150 mL Per Tube Q4H  . gabapentin  200 mg Oral Q8H  . insulin aspart  0-9 Units Subcutaneous Q4H  . insulin aspart  2 Units Subcutaneous Q4H  . losartan  25 mg Oral Daily  . QUEtiapine  25 mg Oral QHS   Continuous Infusions: . sodium chloride 250 mL (12/08/19 2220)  .  ceFAZolin (ANCEF) IV 1 g (12/09/19 0045)  . feeding supplement (JEVITY 1.5 CAL/FIBER) 1,000 mL (12/08/19 1753)  . lacosamide (VIMPAT) IV 150 mg (12/08/19 2223)     LOS: 4 days    Time spent: 30-35 minutes    Ezekiel Slocumb, DO [facility]   If 7PM-7AM, please contact night-coverage [email or web address] Password [facility] 12/09/2019, 7:48 AM

## 2019-12-09 NOTE — Progress Notes (Signed)
Patient has not urinated the whole night. Bladder scan read 348 mL. Paged Randol Kern NP and awaiting direction. Will continue to monitor.

## 2019-12-09 NOTE — Progress Notes (Signed)
New order for I & O received from [facility] NP. Will carry order as received.

## 2019-12-09 NOTE — TOC Transition Note (Signed)
Transition of Care [facility]) - CM/SW Discharge Note   Patient Details  Name: Chad Allen MRN: [id number] Date of Birth: May 03, 1941  [facility] CM/SW Contact:  Truitt Merle, LCSW Phone Number: 12/09/2019, 4:35 PM   Clinical Narrative:    Patient ready for discharge today and made aware. Left voicemail with daughter. Patient returning to long term care facility-[facility]. Spoke with Marlaine Hind and Mickel Baas to update on new meds and nutrition needs and received confirmation patient can return to facility. Updated Dr. Arbutus Ped and RN Cherlyn Labella. Report to be called to [phone]. FL-2 updated and sent via hub. D/C summary faxed to [phone]; fax confirmation received. Patient to transport via [facility]. Transportation packet with DNR on chart. LCSW signing off as no further needs identified.   Final next level of care: Skilled Nursing Facility Barriers to Discharge: No Barriers Identified   Patient Goals and CMS Choice        Discharge Placement              Patient chooses bed at: [facility](Long Term Care) Patient to be transferred to facility by: [facility] Name of family member notified: Left voicemail with daughter Eliyas Noor [phone] Patient and family notified of of transfer: 12/09/19  Discharge Plan and Services   Discharge Planning Services: CM Consult                                 Social Determinants of Health (SDOH) Interventions     Readmission Risk Interventions No flowsheet data found.

## 2019-12-09 NOTE — Progress Notes (Signed)
New order to D/C tele order per Dr. Priscella Mann

## 2019-12-09 NOTE — Discharge Summary (Addendum)
Physician Discharge Summary  Chad Allen [id number] DOB: [birth year] DOA: 12/04/2019  PCP: Alvester Morin, MD  Admit date: 12/04/2019 Discharge date: 12/09/2019  Admitted From: SNF - [facility]  Disposition:  SNF - [facility]  Recommendations for Outpatient Follow-up:  1. Follow up with PCP in 1-2 weeks 2. Please obtain BMP/CBC in one week   Home Health: No  Equipment/Devices: None   Discharge Condition: Stable  CODE STATUS: DNR   Diet recommendation: Dysphagia-1 (pureed), thin liquids + tube feeding (Jevity)   Tube Feeds:  Jevity 1.5 @60ml /hr  (Initiate at 38ml/hr and increase by 29ml/hr q 8 hours until goal rate of 38ml/hr is reached) Free water flushes 127ml q4 hours  Regimen provides 2160kca/day, 92g/day protein, 30g/day fiber, 1930ml/day free water    Brief/Interim Summary:  78 y.o.malenursing home resident with a history of dementia, gastrostomy tube, on chronic home O2, non-insulin-dependent type 2 diabetes and hypertension,sent to theEDafter he was found with decreased responsiveness, drooling on himself and appearing to be short of breath.Arrival of EMS they recorded an O2 sat of 84% on room air that improved to 97% on nonrebreather. By arrival in the emergency room O2 sats in the high 90s via nonrebreather. He was afebrile but pulse was 114 and respirations 25. After short time in the emergency room he became more responsive and was transitioned to O2 by nasal cannula at 4 L.ABG showed a pH of 7.46, PCO2 of 46 and PO2 of 106.Labs notable for WBC12k, mild hypokalemiapotassium of 3.3,elevated inflammatory markers including ferritin 82 triglycerides 120 LDH 151 and elevated D-dimer. Procalcitonin was up at 0.37.UAconsistent with UTI. Patient was treated persepsis protocol,with IV fluid resuscitation and antibiotics. CTA chest obtained and ruled out PE. COVID-19 negative. He was initially treated for possible CA-PNA and UTI.  Patient remained free of respiratory symptoms since initial presentation, CT personally reviewed and does not appear clinically or by imaging to have pneumonia. Urine culture growing Klebsiella pneumoniae, antibiotics de-escalated to cefazolin.  Patient has history of trigeminal neuralgia, has been flared during admission, likely due to being NPO initially and not getting Tegretol. Patient has associated difficulty talking and swallowing due to pain. Medications resumed by his G tube.Tube feedings also started to support nutrition as patient has lost 36 pounds since G-J tube placed in March and only oral diet for past 3 months.  Tube was placed at [facility] due to severe [place] pain he was unable to tolerate eating.  Pain has been improving with resuming Tegretol, now tolerating some oral intake, PO meds in applesauce.     Discharge Diagnoses: Principal Problem:   Sepsis ([place]) Active Problems:   UTI (urinary tract infection)   Hypokalemia   Hypomagnesemia   Acute metabolic encephalopathy   Gastrostomy status (HCC)   Dementia without behavioral disturbance (HCC)   Acute on chronic respiratory failure with hypoxia (HCC)   Essential hypertension   Mouth pain  Mouth paindue to Trigeminal neuralgia- acutely flared - trial of viscous lidocaine, morphine PRN - SLP to evaluate -resume Tegretol by G tube today -continuetrial of gabapentin for now  Sepsis secondary to UTI- resolved Urine culture Klebsiella pneumoniae sensitive to cefazolin.Vital signs now stable. -Completed antibiotics  Hypokalemia - repleting Hypomagnesemia - repleting - monitor and replete for goal K>4.0, [id number]  Acute metabolic encephalopathy-resolved Suspect this was primarily due to infection/sepsis. -Maintain fall precautions -Aspiration precautions  Acute on chronic respiratory failure with hypoxia-resolved Patienton baseline 2 L/min O2. Suspect patient's acute hypoxia was due to his altered  mental  status and likely poor/shallow inspirations at that time, as it resolved once patient more responsive and alert.Has not recurred.  Community-acquired pneumonia-ruled out Stopped antibioticsfor this.Clinically, patient does not have respiratory symptoms and imaging did not show infiltrates.  Suspected Covid-19 Infection - ruled out  Essential hypertension-chronic, stable Has had elevated blood pressures despite home regimen, likely due to pain from trigeminal neuralgia -continue home amlodipine and losartan -Increase losartan to 50 mg daily  Gastrostomy status No longer used for feeding  Dementia without behavioral disturbance Appears stable. Resume home Aricept.  Discharge Instructions   Discharge Instructions    Call MD for:  severe uncontrolled pain   Complete by: As directed    Call MD for:  temperature >100.4   Complete by: As directed    Diet - low sodium heart healthy   Complete by: As directed    Increase activity slowly   Complete by: As directed      Allergies as of 12/09/2019   Not on File     Medication List    STOP taking these medications   furosemide 20 MG tablet Commonly known as: LASIX   insulin glargine 100 unit/mL Sopn Commonly known as: LANTUS     TAKE these medications   acetaminophen 325 MG tablet Commonly known as: TYLENOL Take 650 mg by mouth every 6 (six) hours as needed.   ALPRAZolam 0.5 MG tablet Commonly known as: XANAX Take 0.5 mg by mouth every 8 (eight) hours as needed for anxiety.   amLODipine 10 MG tablet Commonly known as: NORVASC Take 1 tablet (10 mg total) by mouth daily. Start taking on: December 10, 2019   Aspercreme w/Lidocaine 4 % Crea Generic drug: Lidocaine HCl Apply topically 2 (two) times daily. Apply to lower back twice daily for pain   atorvastatin 20 MG tablet Commonly known as: LIPITOR Take 20 mg by mouth at bedtime.   carbamazepine 200 MG tablet Commonly known as: TEGRETOL Take 200 mg by  mouth 4 (four) times daily. Take with 100mg  tablet for a total dose of 300mg  daily   carbamazepine 100 MG chewable tablet Commonly known as: TEGRETOL Chew 100 mg by mouth 4 (four) times daily. Take with 200mg  tablet for a total dose of 300mg  daily.   D3-1000 25 MCG (1000 UT) capsule Generic drug: Cholecalciferol Take 1,000 Units by mouth daily.   donepezil 10 MG tablet Commonly known as: ARICEPT Take 10 mg by mouth at bedtime.   DULoxetine 30 MG capsule Commonly known as: CYMBALTA Take 30 mg by mouth daily.   feeding supplement (JEVITY 1.5 CAL/FIBER) Liqd Place 1,000 mLs into feeding tube continuous.   free water Soln Place 150 mLs into feeding tube every 4 (four) hours.   gabapentin 100 MG capsule Commonly known as: Neurontin Take 2 capsules (200 mg total) by mouth 2 (two) times daily.   insulin aspart 100 UNIT/ML injection Commonly known as: novoLOG Inject 2 Units into the skin every 4 (four) hours.   insulin aspart 100 UNIT/ML injection Commonly known as: novoLOG Inject 0-9 Units into the skin every 4 (four) hours.   latanoprost 0.005 % ophthalmic solution Commonly known as: XALATAN Place 1 drop into both eyes at bedtime.   losartan 50 MG tablet Commonly known as: COZAAR Take 1 tablet (50 mg total) by mouth daily. Start taking on: December 10, 2019   magnesium oxide 400 MG tablet Commonly known as: MAG-OX Take 400 mg by mouth daily.   metFORMIN 500 MG tablet Commonly  known as: GLUCOPHAGE Take 250 mg by mouth 2 (two) times daily with a meal.   mupirocin ointment 2 % Commonly known as: BACTROBAN Place 1 application into the nose daily. Apply to PEG tube insertion site following cleansing with wound cleanser daily until healed.   nystatin 100000 UNIT/ML suspension Commonly known as: MYCOSTATIN Take 5 mLs (500,000 Units total) by mouth 4 (four) times daily.   pantoprazole 20 MG tablet Commonly known as: PROTONIX Take 20 mg by mouth 2 (two) times daily.    QUEtiapine 25 MG tablet Commonly known as: SEROQUEL Take 25 mg by mouth at bedtime.   traZODone 150 MG tablet Commonly known as: DESYREL Take 150 mg by mouth at bedtime.   Vimpat 50 MG Tabs tablet Generic drug: lacosamide Take 150 mg by mouth 2 (two) times daily.       Not on File  Consultations:  none   Procedures/Studies: DG Abd 1 View  Result Date: 12/07/2019 CLINICAL DATA:  Gastrostomy tube.  Diabetes.  Hypertension. EXAM: ABDOMEN - 1 VIEW COMPARISON:  12/05/2019. FINDINGS: Gastrostomy tube noted in unchanged position looped in stomach with tip over the pyloric region. No gastric or bowel distention. No free air. Degenerative change lumbar spine and both hips. Prior lumbar spine fusion. Surgical clips in the pelvis. Penile prosthesis noted. Aortoiliac atherosclerotic vascular calcification. IMPRESSION: Gastrostomy tube in unchanged position looped in stomach with tip over the pyloric region. No gastric or bowel distention. Exam is stable from prior exam. Electronically Signed   By: Marcello Moores  Register   On: 12/07/2019 07:21   DG Abdomen 1 View  Result Date: 12/05/2019 CLINICAL DATA:  Gastrostomy tube placement EXAM: ABDOMEN - 1 VIEW COMPARISON:  Chest CT from earlier today FINDINGS: Percutaneous gastrojejunostomy tube the with inflated bulb distal stomach. The catheter extends upward and loops through the fundus with tip in the region of the pylorus. Excretory nephrograms from recent CTA. Normal bowel gas pattern. Pelvic lymphadenectomy. The bladder is deviated to the right at least partially from rotation. IMPRESSION: Gastrojejunostomy tube with catheter looping through the gastric fundus and tip in the pyloric region. Electronically Signed   By: Monte Fantasia M.D.   On: 12/05/2019 04:31   CT Angio Chest PE W/Cm &/Or Wo Cm  Result Date: 12/05/2019 CLINICAL DATA:  Worsening hypoxia with elevated D-dimer. EXAM: CT ANGIOGRAPHY CHEST WITH CONTRAST TECHNIQUE: Multidetector CT  imaging of the chest was performed using the standard protocol during bolus administration of intravenous contrast. Multiplanar CT image reconstructions and MIPs were obtained to evaluate the vascular anatomy. CONTRAST:  57mL OMNIPAQUE IOHEXOL 350 MG/ML SOLN COMPARISON:  None. FINDINGS: Cardiovascular: Evaluation for pulmonary emboli is limited by suboptimal contrast bolus timing.There is no large centrally located pulmonary embolism. Detection of smaller segmental and subsegmental pulmonary emboli is limited by bolus timing and respiratory motion artifact. The main pulmonary artery is within normal limits for size. There is no CT evidence of acute right heart strain. There are atherosclerotic changes of the thoracic aorta. There is mild narrowing at the origin of the left subclavian artery. Heart size is normal. Coronary artery calcifications are noted. Mediastinum/Nodes: --No mediastinal or hilar lymphadenopathy. --No axillary lymphadenopathy. --No supraclavicular lymphadenopathy. --Normal thyroid gland. --The esophagus is unremarkable Lungs/Pleura: There are mild emphysematous changes bilaterally. There is some mild interlobular septal thickening at the lung apices. There is scarring versus atelectasis in the left lung apex. There is no pneumothorax. There is atelectasis at the lung bases bilaterally. Upper Abdomen: There is a partially visualized  catheter in the gastric body and fundus. Musculoskeletal: No chest wall abnormality. No acute or significant osseous findings. Review of the MIP images confirms the above findings. IMPRESSION: 1. No acute pulmonary embolism given the limitations described above. 2. Mild interlobular septal thickening may represent early interstitial edema. 3. Scattered areas of atelectasis without evidence for a focal infiltrate. 4. Partially visualized catheter in the gastric body and fundus. Correlation with history is recommended. If the patient has a gastrojejunostomy tube, this  catheter may be malpositioned. An abdominal radiograph would be useful for further evaluation. Aortic Atherosclerosis (ICD10-I70.0) and Emphysema (ICD10-J43.9). Electronically Signed   By: Constance Holster M.D.   On: 12/05/2019 03:46   DG Chest Port 1 View  Result Date: 12/05/2019 CLINICAL DATA:  Shortness of breath. Altered level of consciousness. EXAM: PORTABLE CHEST 1 VIEW COMPARISON:  None. FINDINGS: Left lung apex not included in the field of view. Lung volumes are low with bronchovascular crowding. Heart size grossly normal. Atelectasis with mild interstitial thickening at the bases, left greater than. No visualized pneumothorax or large pleural effusion. IMPRESSION: Low lung volumes with bronchovascular crowding. Bibasilar atelectasis with basilar interstitial thickening, chronicity uncertain. No element of basilar infection is considered, primarily on the left. Electronically Signed   By: Keith Rake M.D.   On: 12/05/2019 00:27       Subjective: Patient sleeping comfortably this morning when seen but awoke to voice.  He continues to report mouth pain, but says it is a little better today than yesterday.  Has been able to tolerate his meds in applesauce and some food as well.  He forgets that he is receiving continuous tube feeds and says he does not do tube feeding anymore.  Denies fevers or chills, chest pain or shortness of breath.  No acute events reported.   Discharge Exam: Vitals:   12/09/19 0841 12/09/19 1448  BP: (!) 141/77 (!) 156/78  Pulse: 91 97  Resp: 18 18  Temp: 97.6 F (36.4 C) 98.5 F (36.9 C)  SpO2: 100% 99%   Vitals:   12/08/19 1934 12/09/19 0533 12/09/19 0841 12/09/19 1448  BP: 139/78 (!) 150/78 (!) 141/77 (!) 156/78  Pulse: 89 85 91 97  Resp: 16 18 18 18   Temp: 97.9 F (36.6 C) 97.9 F (36.6 C) 97.6 F (36.4 C) 98.5 F (36.9 C)  TempSrc:  Oral Axillary Oral  SpO2: 99% 98% 100% 99%  Weight:      Height:        General: Pt is alert, awake, not  in acute distress Cardiovascular: RRR, S1/S2 +, no rubs, no gallops Respiratory: CTA bilaterally, no wheezing, no rhonchi Abdominal: Soft, NT, ND, bowel sounds +, G tube present Extremities: no edema, no cyanosis    The results of significant diagnostics from this hospitalization (including imaging, microbiology, ancillary and laboratory) are listed below for reference.     Microbiology: Recent Results (from the past 240 hour(s))  Blood Culture (routine x 2)     Status: None (Preliminary result)   Collection Time: 12/04/19 11:57 PM   Specimen: BLOOD  Result Value Ref Range Status   Specimen Description BLOOD BLOOD LEFT HAND  Final   Special Requests   Final    BOTTLES DRAWN AEROBIC AND ANAEROBIC Blood Culture adequate volume   Culture   Final    NO GROWTH 4 DAYS Performed at [facility], [address] [place], [place] [address]    Report Status PENDING  Incomplete  Blood Culture (routine x  2)     Status: None (Preliminary result)   Collection Time: 12/04/19 11:58 PM   Specimen: BLOOD  Result Value Ref Range Status   Specimen Description BLOOD RIGHT ANTECUBITAL  Final   Special Requests   Final    BOTTLES DRAWN AEROBIC AND ANAEROBIC Blood Culture adequate volume   Culture   Final    NO GROWTH 4 DAYS Performed at [facility], [address], [place], [place] [address]    Report Status PENDING  Incomplete  Urine culture     Status: Abnormal   Collection Time: 12/04/19 11:58 PM   Specimen: Urine, Catheterized  Result Value Ref Range Status   Specimen Description   Final    URINE, CATHETERIZED Performed at [facility], [address], [place], [place] [address]    Special Requests   Final    Normal Performed at [facility], [address] [place], [place] [address]    Culture (A)  Final    >=100,000 COLONIES/mL KLEBSIELLA PNEUMONIAE Two isolates with different morphologies were identified as the same organism.The most  resistant organism was reported. Performed at [facility], [place] [address], [place], [place] [address]    Report Status 12/06/2019 FINAL  Final   Organism ID, Bacteria KLEBSIELLA PNEUMONIAE (A)  Final      Susceptibility   Klebsiella pneumoniae - MIC*    AMPICILLIN >=32 RESISTANT Resistant     CEFAZOLIN <=4 SENSITIVE Sensitive     CEFTRIAXONE <=1 SENSITIVE Sensitive     CIPROFLOXACIN <=0.25 SENSITIVE Sensitive     GENTAMICIN <=1 SENSITIVE Sensitive     IMIPENEM <=0.25 SENSITIVE Sensitive     NITROFURANTOIN 128 RESISTANT Resistant     TRIMETH/SULFA <=20 SENSITIVE Sensitive     AMPICILLIN/SULBACTAM 8 SENSITIVE Sensitive     PIP/TAZO <=4 SENSITIVE Sensitive     * >=100,000 COLONIES/mL KLEBSIELLA PNEUMONIAE  SARS CORONAVIRUS 2 (TAT 6-24 HRS) Nasopharyngeal Nasopharyngeal Swab     Status: None   Collection Time: 12/05/19  1:37 AM   Specimen: Nasopharyngeal Swab  Result Value Ref Range Status   SARS Coronavirus 2 NEGATIVE NEGATIVE Final    Comment: (NOTE) SARS-CoV-2 target nucleic acids are NOT DETECTED. The SARS-CoV-2 RNA is generally detectable in upper and lower respiratory specimens during the acute phase of infection. Negative results do not preclude SARS-CoV-2 infection, do not rule out co-infections with other pathogens, and should not be used as the sole basis for treatment or other patient management decisions. Negative results must be combined with clinical observations, patient history, and epidemiological information. The expected result is Negative. Fact Sheet for Patients: SugarRoll.be Fact Sheet for Healthcare Providers: https://www.woods-mathews.com/ This test is not yet approved or cleared by the [place] FDA and  has been authorized for detection and/or diagnosis of SARS-CoV-2 by FDA under an Emergency Use Authorization (EUA). This EUA will remain  in effect (meaning this test can be used) for the duration of  the COVID-19 declaration under Section 56 4(b)(1) of the Act, 21 U.S.C. section 360bbb-3(b)(1), unless the authorization is terminated or revoked sooner. Performed at [facility], [place] [address], [place], [place] [address]   MRSA PCR Screening     Status: None   Collection Time: 12/06/19  5:30 PM   Specimen: Nasal Mucosa; Nasopharyngeal  Result Value Ref Range Status   MRSA by PCR NEGATIVE NEGATIVE Final    Comment:        The GeneXpert MRSA Assay (FDA approved for  NASAL specimens only), is one component of a comprehensive MRSA colonization surveillance program. It is not intended to diagnose MRSA infection nor to guide or monitor treatment for MRSA infections. Performed at [facility], [address] [place], [place] [address]      Labs: BNP (last 3 results) Recent Labs    12/04/19 2337  BNP [id number]*   Basic Metabolic Panel: Recent Labs  Lab 12/04/19 2342 12/06/19 0626 12/07/19 0834 12/08/19 0514 12/09/19 0558  NA 142 145 142 142 139  K 3.3* 3.2* 3.5 3.3* 3.7  CL 98 108 106 103 103  CO2 25 27 20* 25 28  GLUCOSE 130* 99 112* 137* 110*  BUN 18 14 11 11 16   CREATININE 1.12 0.74 0.88 0.74 0.99  CALCIUM 8.9 8.5* 8.9 9.2 8.6*  MG  --  1.6* 1.7 2.1  --   PHOS  --   --   --  2.7  --    Liver Function Tests: Recent Labs  Lab 12/04/19 2342  AST 12*  ALT 8  ALKPHOS 113  BILITOT 0.9  PROT 8.5*  ALBUMIN 3.4*   No results for input(s): LIPASE, AMYLASE in the last 168 hours. No results for input(s): AMMONIA in the last 168 hours. CBC: Recent Labs  Lab 12/04/19 2342 12/06/19 0626  WBC 12.2* 7.8  NEUTROABS 10.1* 5.6  HGB 11.4* 9.3*  HCT 36.7* 28.6*  MCV 87.4 84.4  PLT 387 287   Cardiac Enzymes: No results for input(s): CKTOTAL, CKMB, CKMBINDEX, TROPONINI in the last 168 hours. BNP: Invalid input(s): POCBNP CBG: Recent Labs  Lab 12/08/19 2129 12/09/19 0003 12/09/19 0423 12/09/19 0809 12/09/19 1153  GLUCAP 100* 121* 111* 100*  143*   D-Dimer No results for input(s): DDIMER in the last 72 hours. Hgb A1c No results for input(s): HGBA1C in the last 72 hours. Lipid Profile No results for input(s): CHOL, HDL, LDLCALC, TRIG, CHOLHDL, LDLDIRECT in the last 72 hours. Thyroid function studies No results for input(s): TSH, T4TOTAL, T3FREE, THYROIDAB in the last 72 hours.  Invalid input(s): FREET3 Anemia work up No results for input(s): VITAMINB12, FOLATE, FERRITIN, TIBC, IRON, RETICCTPCT in the last 72 hours. Urinalysis    Component Value Date/Time   COLORURINE YELLOW (A) 12/04/2019 2358   APPEARANCEUR CLOUDY (A) 12/04/2019 2358   LABSPEC 1.014 12/04/2019 2358   PHURINE 5.0 12/04/2019 2358   GLUCOSEU NEGATIVE 12/04/2019 2358   HGBUR SMALL (A) 12/04/2019 2358   BILIRUBINUR NEGATIVE 12/04/2019 2358   KETONESUR 5 (A) 12/04/2019 2358   PROTEINUR 100 (A) 12/04/2019 2358   NITRITE NEGATIVE 12/04/2019 2358   LEUKOCYTESUR LARGE (A) 12/04/2019 2358   Sepsis Labs Invalid input(s): PROCALCITONIN,  WBC,  LACTICIDVEN Microbiology Recent Results (from the past 240 hour(s))  Blood Culture (routine x 2)     Status: None (Preliminary result)   Collection Time: 12/04/19 11:57 PM   Specimen: BLOOD  Result Value Ref Range Status   Specimen Description BLOOD BLOOD LEFT HAND  Final   Special Requests   Final    BOTTLES DRAWN AEROBIC AND ANAEROBIC Blood Culture adequate volume   Culture   Final    NO GROWTH 4 DAYS Performed at [facility], [address] [place],  [address]    Report Status PENDING  Incomplete  Blood Culture (routine x 2)     Status: None (Preliminary result)   Collection Time: 12/04/19 11:58 PM   Specimen: BLOOD  Result Value Ref Range Status   Specimen Description BLOOD RIGHT  ANTECUBITAL  Final   Special Requests   Final    BOTTLES DRAWN AEROBIC AND ANAEROBIC Blood Culture adequate volume   Culture   Final    NO GROWTH 4 DAYS Performed at [facility], [address] [place], [place] [address]    Report Status PENDING  Incomplete  Urine culture     Status: Abnormal   Collection Time: 12/04/19 11:58 PM   Specimen: Urine, Catheterized  Result Value Ref Range Status   Specimen Description   Final    URINE, CATHETERIZED Performed at [facility], [address], [place], [place] [address]    Special Requests   Final    Normal Performed at [facility], [address] [place], [place] [address]    Culture (A)  Final    >=100,000 COLONIES/mL KLEBSIELLA PNEUMONIAE Two isolates with different morphologies were identified as the same organism.The most resistant organism was reported. Performed at [facility], [place] [address] [place], [place] [address]    Report Status 12/06/2019 FINAL  Final   Organism ID, Bacteria KLEBSIELLA PNEUMONIAE (A)  Final      Susceptibility   Klebsiella pneumoniae - MIC*    AMPICILLIN >=32 RESISTANT Resistant     CEFAZOLIN <=4 SENSITIVE Sensitive     CEFTRIAXONE <=1 SENSITIVE Sensitive     CIPROFLOXACIN <=0.25 SENSITIVE Sensitive     GENTAMICIN <=1 SENSITIVE Sensitive     IMIPENEM <=0.25 SENSITIVE Sensitive     NITROFURANTOIN 128 RESISTANT Resistant     TRIMETH/SULFA <=20 SENSITIVE Sensitive     AMPICILLIN/SULBACTAM 8 SENSITIVE Sensitive     PIP/TAZO <=4 SENSITIVE Sensitive     * >=100,000 COLONIES/mL KLEBSIELLA PNEUMONIAE  SARS CORONAVIRUS 2 (TAT 6-24 HRS) Nasopharyngeal Nasopharyngeal Swab     Status: None   Collection Time: 12/05/19  1:37 AM   Specimen: Nasopharyngeal Swab  Result Value Ref Range Status   SARS Coronavirus 2 NEGATIVE NEGATIVE Final    Comment: (NOTE) SARS-CoV-2 target nucleic acids are NOT DETECTED. The SARS-CoV-2 RNA is generally detectable in upper and lower respiratory specimens during the acute phase of infection. Negative results do not preclude SARS-CoV-2 infection, do not rule out co-infections with other pathogens, and should not be used as the sole basis for  treatment or other patient management decisions. Negative results must be combined with clinical observations, patient history, and epidemiological information. The expected result is Negative. Fact Sheet for Patients: SugarRoll.be Fact Sheet for Healthcare Providers: https://www.woods-mathews.com/ This test is not yet approved or cleared by the [place] FDA and  has been authorized for detection and/or diagnosis of SARS-CoV-2 by FDA under an Emergency Use Authorization (EUA). This EUA will remain  in effect (meaning this test can be used) for the duration of the COVID-19 declaration under Section 56 4(b)(1) of the Act, 21 U.S.C. section 360bbb-3(b)(1), unless the authorization is terminated or revoked sooner. Performed at [facility], [place] [address], [place], [place] [address]   MRSA PCR Screening     Status: None   Collection Time: 12/06/19  5:30 PM   Specimen: Nasal Mucosa; Nasopharyngeal  Result Value Ref Range Status   MRSA by PCR NEGATIVE NEGATIVE Final    Comment:        The GeneXpert MRSA Assay (FDA approved for NASAL specimens only), is one component of a comprehensive MRSA colonization surveillance program. It is not intended to diagnose MRSA infection nor to guide or monitor treatment for MRSA infections. Performed at  [facility], [address], [place], [place] [address]      Time coordinating discharge: Over 30 minutes  SIGNED:   Ezekiel Slocumb, DO [facility] 12/09/2019, 3:16 PM   If 7PM-7AM, please contact night-coverage [email or web address] Password [email or web address]

## 2019-12-09 NOTE — Progress Notes (Signed)
Pt is being discharged to [facility].  Called report to Sharren Bridge, LPN.  Discharge papers placed in discharge packet for receiving facility.  Awaiting EMS.

## 2019-12-10 LAB — CULTURE, BLOOD (ROUTINE X 2)
Culture: NO GROWTH
Culture: NO GROWTH
Special Requests: ADEQUATE
Special Requests: ADEQUATE

## 2019-12-11 ENCOUNTER — Emergency Department
Admission: EM | Admit: 2019-12-11 | Discharge: 2019-12-11 | Disposition: A | Discharge status: Other Acute Inpatient Hospital | Payer: Medicare Other | Attending: Emergency Medicine | Admitting: Emergency Medicine

## 2019-12-11 ENCOUNTER — Other Ambulatory Visit: Payer: Self-pay

## 2019-12-11 DIAGNOSIS — Z7984 Long term (current) use of oral hypoglycemic drugs: Secondary | ICD-10-CM | POA: Diagnosis not present

## 2019-12-11 DIAGNOSIS — F0390 Unspecified dementia without behavioral disturbance: Secondary | ICD-10-CM | POA: Insufficient documentation

## 2019-12-11 DIAGNOSIS — T85598A Other mechanical complication of other gastrointestinal prosthetic devices, implants and grafts, initial encounter: Secondary | ICD-10-CM | POA: Diagnosis not present

## 2019-12-11 DIAGNOSIS — I10 Essential (primary) hypertension: Secondary | ICD-10-CM | POA: Insufficient documentation

## 2019-12-11 DIAGNOSIS — K9423 Gastrostomy malfunction: Secondary | ICD-10-CM

## 2019-12-11 DIAGNOSIS — E119 Type 2 diabetes mellitus without complications: Secondary | ICD-10-CM | POA: Insufficient documentation

## 2019-12-11 DIAGNOSIS — Y69 Unspecified misadventure during surgical and medical care: Secondary | ICD-10-CM | POA: Diagnosis not present

## 2019-12-11 DIAGNOSIS — Z79899 Other long term (current) drug therapy: Secondary | ICD-10-CM | POA: Diagnosis not present

## 2019-12-11 MED ORDER — PANCRELIPASE (LIP-PROT-AMYL) 10440-39150 UNITS PO TABS
20880.0000 [IU] | ORAL_TABLET | Freq: Once | ORAL | Status: AC
  Administered 2019-12-11: 20880 [IU]
  Filled 2019-12-11 (×3): qty 2

## 2019-12-11 MED ORDER — SODIUM BICARBONATE 650 MG PO TABS
650.0000 mg | ORAL_TABLET | Freq: Once | ORAL | Status: AC
  Administered 2019-12-11: 650 mg
  Filled 2019-12-11 (×2): qty 1

## 2019-12-11 NOTE — Discharge Instructions (Addendum)
Please make sure to flush the G tube regularly

## 2019-12-11 NOTE — ED Notes (Addendum)
REPORT PROVIDED BACK TO [facility]. EMS IN ROUTE TO TRANSPORT PT BACK TO FACILITY.

## 2019-12-11 NOTE — ED Notes (Signed)
FLUSHING 50ML OF NS WITH EASE. MD MADE AWARE.

## 2019-12-11 NOTE — ED Triage Notes (Signed)
Pt via EMS from [facility], pt states that his feeding tube has been clogged since yesterday. Pt denies pain and has no other complaints at this time.

## 2019-12-11 NOTE — ED Provider Notes (Signed)
[facility] Emergency Department Provider Note  ____________________________________________   First MD Initiated Contact with Patient 12/11/19 1708     (approximate)  I have reviewed the triage vital signs and the nursing notes.   HISTORY  Chief Complaint No chief complaint on file.    HPI Arly Abdul-Mumit is a 78 y.o. male with extensive past medical history as below including dementia, chronic dysphagia with G-tube, here with inability to flush the G-tube.  The patient was just recently hospitalized for sepsis and trigeminal neuralgia.  He had been recovering well and was discharged to the facility.  Per report, the facility was not able to flush his G-tube tonight.  He is on intermittent feeds for nutrition.  He does not take anything p.o.  There was no trauma to the area.  No drainage around the area.  Patient declines any pain on my exam, though history is limited due to dementia.  Level 5 caveat invoked as remainder of history, ROS, and physical exam limited due to patient's dementia.         Past Medical History:  Diagnosis Date  . Abnormal posture   . Dementia ([place])   . Dependence on continuous supplemental oxygen    2L O2 by [place]  . DNR (do not resuscitate)   . Feeding by G-tube ([place])   . Generalized muscle weakness   . GERD (gastroesophageal reflux disease)   . Hyperlipidemia   . Hypertension   . Malignant neoplasm of prostate ([place])   . Mood disorder ([place])   . Osteoarthritis   . PTSD (post-traumatic stress disorder)   . Trigeminal neuralgia   . Type 2 diabetes mellitus without complications ([place])   . Unsteadiness on feet     Patient Active Problem List   Diagnosis Date Noted  . Hypokalemia 12/06/2019  . Hypomagnesemia 12/06/2019  . Mouth pain 12/06/2019  . Acute metabolic encephalopathy 123XX123  . Sepsis ([place]) 12/05/2019  . UTI (urinary tract infection) 12/05/2019  . 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 Dementia without  behavioral disturbance ([place]) 12/05/2019  . Acute on chronic respiratory failure with hypoxia ([place]) 12/05/2019  . Essential hypertension 12/05/2019    Past Surgical History:  Procedure Laterality Date  . GASTROSTOMY      Prior to Admission medications   Medication Sig Start Date End Date Taking? Authorizing Provider  acetaminophen (TYLENOL) 325 MG tablet Take 650 mg by mouth every 6 (six) hours as needed.    [provider]  ALPRAZolam Duanne Moron) 0.5 MG tablet Take 0.5 mg by mouth every 8 (eight) hours as needed for anxiety.    [provider]  amLODipine (NORVASC) 10 MG tablet Take 1 tablet (10 mg total) by mouth daily. 12/10/19   Ezekiel Slocumb, DO  atorvastatin (LIPITOR) 20 MG tablet Take 20 mg by mouth at bedtime.    [provider]  carbamazepine (TEGRETOL) 100 MG chewable tablet Chew 100 mg by mouth 4 (four) times daily. Take with 200mg  tablet for a total dose of 300mg  daily.    [provider]  carbamazepine (TEGRETOL) 200 MG tablet Take 200 mg by mouth 4 (four) times daily. Take with 100mg  tablet for a total dose of 300mg  daily    [provider]  Cholecalciferol (D3-1000) 25 MCG (1000 UT) capsule Take 1,000 Units by mouth daily.    [provider]  donepezil (ARICEPT) 10 MG tablet Take 10 mg by mouth at bedtime.    [provider]  DULoxetine (  CYMBALTA) 30 MG capsule Take 30 mg by mouth daily.    [provider]  gabapentin (NEURONTIN) 100 MG capsule Take 2 capsules (200 mg total) by mouth 2 (two) times daily. 12/09/19 12/08/20  Nicole Kindred A, DO  insulin aspart (NOVOLOG) 100 UNIT/ML injection Inject 2 Units into the skin every 4 (four) hours. 12/09/19   Nicole Kindred A, DO  insulin aspart (NOVOLOG) 100 UNIT/ML injection Inject 0-9 Units into the skin every 4 (four) hours. 12/09/19   Ezekiel Slocumb, DO  lacosamide (VIMPAT) 50 MG TABS tablet Take 150 mg by mouth 2 (two) times daily.    [provider]  latanoprost (XALATAN) 0.005 % ophthalmic solution Place 1 drop into both eyes at bedtime.    [provider]  Lidocaine HCl (ASPERCREME W/LIDOCAINE) 4 % CREA Apply topically 2 (two) times daily. Apply to lower back twice daily for pain    [provider]  losartan (COZAAR) 50 MG tablet Take 1 tablet (50 mg total) by mouth daily. 12/10/19   Nicole Kindred A, DO  magnesium oxide (MAG-OX) 400 MG tablet Take 400 mg by mouth daily.    [provider]  metFORMIN (GLUCOPHAGE) 500 MG tablet Take 250 mg by mouth 2 (two) times daily with a meal.    [provider]  mupirocin ointment (BACTROBAN) 2 % Place 1 application into the nose daily. Apply to PEG tube insertion site following cleansing with wound cleanser daily until healed.    [provider]  Nutritional Supplements (FEEDING SUPPLEMENT, JEVITY 1.5 CAL/FIBER,) LIQD Place 1,000 mLs into feeding tube continuous. 12/09/19   Ezekiel Slocumb, DO  nystatin (MYCOSTATIN) 100000 UNIT/ML suspension Take 5 mLs (500,000 Units total) by mouth 4 (four) times daily. 12/09/19   Nicole Kindred A, DO  pantoprazole (PROTONIX) 20 MG tablet Take 20 mg by mouth 2 (two) times daily.    [provider]  QUEtiapine (SEROQUEL) 25 MG tablet Take 25 mg by mouth at bedtime.    [provider]  traZODone (DESYREL) 150 MG tablet Take 150 mg by mouth at bedtime.    [provider]  Water For Irrigation, Sterile (FREE WATER) SOLN Place 150 mLs into feeding tube every 4 (four) hours. 12/09/19   Ezekiel Slocumb, DO    Allergies Lisinopril  No family history on file.  Social History Social History   Tobacco Use  . Smoking status: Unknown If Ever Smoked  Substance Use Topics  . Alcohol use: Not on file  . Drug use: Not on file    Review of Systems  Review of Systems  Unable to perform ROS: Dementia     ____________________________________________  PHYSICAL EXAM:      VITAL  SIGNS: ED Triage Vitals  Enc Vitals Group     BP 12/11/19 1654 (!) 150/89     Pulse Rate 12/11/19 1654 99     Resp 12/11/19 1654 16     Temp 12/11/19 1654 98.6 F (37 C)     Temp Source 12/11/19 1654 Oral     SpO2 12/11/19 1654 94 %     Weight 12/11/19 1655 230 lb (104.3 kg)     Height 12/11/19 1655 5\' 7"  (1.702 m)     Head Circumference --      Peak Flow --      Pain Score 12/11/19 1655 0     Pain Loc --      Pain Edu? --      Excl. in  [place]? --      Physical Exam Vitals and nursing note reviewed.  Constitutional:      General: He is not in acute distress.    Appearance: He is well-developed.     Comments: Chronically ill appearing  HENT:     Head: Normocephalic and atraumatic.  Eyes:     Conjunctiva/sclera: Conjunctivae normal.  Cardiovascular:     Rate and Rhythm: Normal rate and regular rhythm.     Heart sounds: Normal heart sounds.  Pulmonary:     Effort: Pulmonary effort is normal. No respiratory distress.     Breath sounds: No wheezing.  Abdominal:     General: There is no distension.     Comments: G tube in place, no surrounding drainage, erythema, no apparent abd TTP  Musculoskeletal:     Cervical back: Neck supple.  Skin:    General: Skin is warm.     Capillary Refill: Capillary refill takes less than 2 seconds.     Findings: No rash.  Neurological:     Mental Status: He is alert. He is disoriented.     Motor: No abnormal muscle tone.       ____________________________________________   LABS (all labs ordered are listed, but only abnormal results are displayed)  Labs Reviewed - No data to display  ____________________________________________  EKG: None ________________________________________  RADIOLOGY All imaging, including plain films, CT scans, and ultrasounds, independently reviewed by me, and interpretations confirmed via formal radiology reads.  ED MD interpretation:   None  Official radiology report(s): No results  found.  ____________________________________________  PROCEDURES   Procedure(s) performed (including Critical Care):  Procedures  ____________________________________________  INITIAL IMPRESSION / MDM / [place] / ED COURSE  As part of my medical decision making, I reviewed the following data within the [facility] notes reviewed and incorporated, Old chart reviewed, Notes from prior ED visits, and [place] Controlled Substance Database       *Karee Abdul-Mumit was evaluated in Emergency Department on 12/11/2019 for the symptoms described in the history of present illness. He was evaluated in the context of the global COVID-19 pandemic, which necessitated consideration that the patient might be at risk for infection with the SARS-CoV-2 virus that causes COVID-19. Institutional protocols and algorithms that pertain to the evaluation of patients at risk for COVID-19 are in a state of rapid change based on information released by regulatory bodies including the CDC and federal and state organizations. These policies and algorithms were followed during the patient's care in the ED.  Some ED evaluations and interventions may be delayed as a result of limited staffing during the pandemic.*  Clinical Course as of Dec 10 2238  Tue Dec 11, 2019  2120 78 yo M here with clogged feeding tube. VSS. Pt is otherwise very well appearing and was just seen and discharged. Abdomen is soft, NT, ND. Will attempt to declog, reassess.   [CI]    Clinical Course User Index [CI] Duffy Bruce, MD    Medical Decision Making:  G tube flushed with bicarb/pancreatic enzymes w/ good effect. Tolerated well. Pt remains otherwise asymptomatic and without complaints. Abdomen soft. Will discharge back to facility.  ____________________________________________  FINAL CLINICAL IMPRESSION(S) / ED DIAGNOSES  Final diagnoses:  Gastrostomy tube obstruction (HCC)     MEDICATIONS GIVEN  DURING THIS VISIT:  Medications  lipase/protease/amylase) (VIOKACE) tablets 20,880 Units (has no administration in time range)    And  sodium bicarbonate tablet 650 mg (has  no administration in time range)     ED Discharge Orders    None       Note:  This document was prepared using Dragon voice recognition software and may include unintentional dictation errors.   Duffy Bruce, MD 12/11/19 2239

## 2019-12-20 ENCOUNTER — Emergency Department
Admission: EM | Admit: 2019-12-20 | Discharge: 2019-12-20 | Disposition: A | Discharge status: Home / Self Care | Payer: No Typology Code available for payment source | Source: Home / Self Care | Attending: Emergency Medicine | Admitting: Emergency Medicine

## 2019-12-20 ENCOUNTER — Other Ambulatory Visit: Payer: Self-pay

## 2019-12-20 ENCOUNTER — Encounter: Payer: Self-pay | Admitting: Medical Oncology

## 2019-12-20 DIAGNOSIS — K9423 Gastrostomy malfunction: Secondary | ICD-10-CM | POA: Insufficient documentation

## 2019-12-20 DIAGNOSIS — Z794 Long term (current) use of insulin: Secondary | ICD-10-CM | POA: Insufficient documentation

## 2019-12-20 DIAGNOSIS — E119 Type 2 diabetes mellitus without complications: Secondary | ICD-10-CM | POA: Insufficient documentation

## 2019-12-20 DIAGNOSIS — F0390 Unspecified dementia without behavioral disturbance: Secondary | ICD-10-CM | POA: Insufficient documentation

## 2019-12-20 DIAGNOSIS — I10 Essential (primary) hypertension: Secondary | ICD-10-CM | POA: Insufficient documentation

## 2019-12-20 DIAGNOSIS — Z79899 Other long term (current) drug therapy: Secondary | ICD-10-CM | POA: Insufficient documentation

## 2019-12-20 DIAGNOSIS — E875 Hyperkalemia: Secondary | ICD-10-CM | POA: Diagnosis not present

## 2019-12-20 DIAGNOSIS — N3000 Acute cystitis without hematuria: Secondary | ICD-10-CM | POA: Diagnosis not present

## 2019-12-20 NOTE — Discharge Instructions (Addendum)
As we discussed please follow-up with [facility] to discuss further care of your GJ tube.  You may also follow-up locally if you choose to do so by calling the number provided.  If the patient is unable to take his medications or feedings orally please return to the emergency department.

## 2019-12-20 NOTE — ED Notes (Signed)
Pt unable to sign due to dementia

## 2019-12-20 NOTE — ED Provider Notes (Addendum)
[facility] Emergency Department Provider Note  Time seen: 4:49 PM  I have reviewed the triage vital signs and the nursing notes.   HISTORY  Chief Complaint g-tube clogged   HPI Chad Allen is a 78 y.o. male with a past medical history of dementia, gastric reflux, hypertension, hyperlipidemia, presents to the emergency department for a malfunctioning G-tube.  According to EMS report they attempted to use the G-tube to give the patient his medications tonight and they were unable to do so so they sent the patient to the emergency department.  Upon arrival to the emergency department patient has a tube but there is no port on the end of the tube.  It appears to be a triple lumen tube but again no port to inject the tube.  After some research into the patient's chart it appears that he has a GJ tube which would explain the 3 ports.  Unable to remove the GJ tube.  Unable to properly flush the GJ tube given there is no port.   Past Medical History:  Diagnosis Date  . Abnormal posture   . Dementia ([place])   . Dependence on continuous supplemental oxygen    2L O2 by [place]  . DNR (do not resuscitate)   . Feeding by G-tube ([place])   . Generalized muscle weakness   . GERD (gastroesophageal reflux disease)   . Hyperlipidemia   . Hypertension   . Malignant neoplasm of prostate ([place])   . Mood disorder ([place])   . Osteoarthritis   . PTSD (post-traumatic stress disorder)   . Trigeminal neuralgia   . Type 2 diabetes mellitus without complications ([place])   . Unsteadiness on feet     Patient Active Problem List   Diagnosis Date Noted  . Hypokalemia 12/06/2019  . Hypomagnesemia 12/06/2019  . Mouth pain 12/06/2019  . Acute metabolic encephalopathy 123XX123  . Sepsis ([place]) 12/05/2019  . UTI (urinary tract infection) 12/05/2019  . 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 Dementia without behavioral disturbance ([place]) 12/05/2019  . Acute on chronic respiratory failure with  hypoxia ([place]) 12/05/2019  . Essential hypertension 12/05/2019    Past Surgical History:  Procedure Laterality Date  . GASTROSTOMY      Prior to Admission medications   Medication Sig Start Date End Date Taking? Authorizing Provider  acetaminophen (TYLENOL) 325 MG tablet Take 650 mg by mouth every 6 (six) hours as needed.    [provider]  ALPRAZolam Duanne Moron) 0.5 MG tablet Take 0.5 mg by mouth every 8 (eight) hours as needed for anxiety.    [provider]  amLODipine (NORVASC) 10 MG tablet Take 1 tablet (10 mg total) by mouth daily. 12/10/19   Ezekiel Slocumb, DO  atorvastatin (LIPITOR) 20 MG tablet Take 20 mg by mouth at bedtime.    [provider]  carbamazepine (TEGRETOL) 100 MG chewable tablet Chew 100 mg by mouth 4 (four) times daily. Take with 200mg  tablet for a total dose of 300mg  daily.    [provider]  carbamazepine (TEGRETOL) 200 MG tablet Take 200 mg by mouth 4 (four) times daily. Take with 100mg  tablet for a total dose of 300mg  daily    [provider]  Cholecalciferol (D3-1000) 25 MCG (1000 UT) capsule Take 1,000 Units by mouth daily.    [provider]  donepezil (ARICEPT) 10 MG tablet Take 10 mg by mouth at bedtime.    [provider]  DULoxetine (CYMBALTA) 30 MG capsule Take 30 mg  by mouth daily.    [provider]  gabapentin (NEURONTIN) 100 MG capsule Take 2 capsules (200 mg total) by mouth 2 (two) times daily. 12/09/19 12/08/20  Nicole Kindred A, DO  insulin aspart (NOVOLOG) 100 UNIT/ML injection Inject 2 Units into the skin every 4 (four) hours. 12/09/19   Nicole Kindred A, DO  insulin aspart (NOVOLOG) 100 UNIT/ML injection Inject 0-9 Units into the skin every 4 (four) hours. 12/09/19   Ezekiel Slocumb, DO  lacosamide (VIMPAT) 50 MG TABS tablet Take 150 mg by mouth 2 (two) times daily.    [provider]  latanoprost (XALATAN) 0.005 % ophthalmic solution Place 1 drop into both eyes  at bedtime.    [provider]  Lidocaine HCl (ASPERCREME W/LIDOCAINE) 4 % CREA Apply topically 2 (two) times daily. Apply to lower back twice daily for pain    [provider]  losartan (COZAAR) 50 MG tablet Take 1 tablet (50 mg total) by mouth daily. 12/10/19   Nicole Kindred A, DO  magnesium oxide (MAG-OX) 400 MG tablet Take 400 mg by mouth daily.    [provider]  metFORMIN (GLUCOPHAGE) 500 MG tablet Take 250 mg by mouth 2 (two) times daily with a meal.    [provider]  mupirocin ointment (BACTROBAN) 2 % Place 1 application into the nose daily. Apply to PEG tube insertion site following cleansing with wound cleanser daily until healed.    [provider]  Nutritional Supplements (FEEDING SUPPLEMENT, JEVITY 1.5 CAL/FIBER,) LIQD Place 1,000 mLs into feeding tube continuous. 12/09/19   Ezekiel Slocumb, DO  nystatin (MYCOSTATIN) 100000 UNIT/ML suspension Take 5 mLs (500,000 Units total) by mouth 4 (four) times daily. 12/09/19   Nicole Kindred A, DO  pantoprazole (PROTONIX) 20 MG tablet Take 20 mg by mouth 2 (two) times daily.    [provider]  QUEtiapine (SEROQUEL) 25 MG tablet Take 25 mg by mouth at bedtime.    [provider]  traZODone (DESYREL) 150 MG tablet Take 150 mg by mouth at bedtime.    [provider]  Water For Irrigation, Sterile (FREE WATER) SOLN Place 150 mLs into feeding tube every 4 (four) hours. 12/09/19   Ezekiel Slocumb, DO    Allergies  Allergen Reactions  . Lisinopril     No family history on file.  Social History Social History   Tobacco Use  . Smoking status: Unknown If Ever Smoked  Substance Use Topics  . Alcohol use: Not on file  . Drug use: Not on file    Review of Systems Constitutional: Negative for fever Cardiovascular: Negative for chest pain. Respiratory: Negative for shortness of breath. Gastrointestinal: Negative for abdominal pain Musculoskeletal: Negative for  musculoskeletal complaints Neurological: Negative for headache All other ROS negative although possibly limited by some mild dementia.  ____________________________________________   PHYSICAL EXAM:  VITAL SIGNS: ED Triage Vitals  Enc Vitals Group     BP 12/20/19 1610 (!) 110/46     Pulse Rate 12/20/19 1610 (!) 110     Resp 12/20/19 1610 16     Temp 12/20/19 1610 99 F (37.2 C)     Temp Source 12/20/19 1610 Oral     SpO2 12/20/19 1610 100 %     Weight 12/20/19 1611 229 lb 4.5 oz (104 kg)     Height 12/20/19 1611 5\' 7"  (1.702 m)     Head Circumference --      Peak Flow --  Pain Score 12/20/19 1611 0     Pain Loc --      Pain Edu? --      Excl. in [place]? --    Constitutional: Patient is awake alert, no acute distress. Eyes: Normal exam ENT      Head: Normocephalic and atraumatic. Cardiovascular: Normal rate, regular rhythm.  Respiratory: Normal respiratory effort without tachypnea nor retractions. Breath sounds are clear  Gastrointestinal: Soft and nontender. No distention.  G-tube present without port. Musculoskeletal: Nontender with normal range of motion in all extremities Neurologic:  Normal speech and language. Skin:  Skin is warm, dry and intact.  Psychiatric: Mood and affect are normal.   ____________________________________________   INITIAL IMPRESSION / ASSESSMENT AND PLAN / ED COURSE  Pertinent labs & imaging results that were available during my care of the patient were reviewed by me and considered in my medical decision making (see chart for details).   Patient presents from nursing home for a malfunctioning G-tube.  After research into the patient's chart it appears that he has a GJ tube.  There are 3 ports which is also consistent with a GJ tube.  Unable to remove the GJ tube.  We do not have supplies for GJ tubes in the emergency department including replacement ports.  The port was apparently taken off at the nursing home.  I spoke to the nurse at the  nursing home she is not sure why the port was taken off she is not sure if they still have the port or if they discarded it.  She states however the patient is able to take all of his medications and feedings orally but often times to use the G-tube for convenience purposes.  In reviewing the patient's chart he does have a history of aspiration which is why they chose to leave the [place] tube and back in September.  However as a nurse states the patient is able to feed and take his medications orally does not appear to be any emergent need to replace the [place] tube.  Again as we do not have [place] supplies in the emergency department there is little we can do for this issue at this time.  I discussed with the nurse at the nursing home and they will follow up with [facility] who placed the [place] tube.   Chad Allen was evaluated in Emergency Department on 12/20/2019 for the symptoms described in the history of present illness. He was evaluated in the context of the global COVID-19 pandemic, which necessitated consideration that the patient might be at risk for infection with the SARS-CoV-2 virus that causes COVID-19. Institutional protocols and algorithms that pertain to the evaluation of patients at risk for COVID-19 are in a state of rapid change based on information released by regulatory bodies including the CDC and federal and state organizations. These policies and algorithms were followed during the patient's care in the ED.  ____________________________________________   FINAL CLINICAL IMPRESSION(S) / ED DIAGNOSES  Malfunctioning G-tube   Harvest Dark, MD 12/20/19 1653    Harvest Dark, MD 12/20/19 1655

## 2019-12-20 NOTE — ED Triage Notes (Signed)
Pt from [facility] via ems- per staff there, they have been unable to flush g-tube for past 3 hrs. Pt denies other complaints. Pain free.

## 2019-12-20 NOTE — ED Notes (Signed)
Report given to [facility] by Dr. Kerman Passey

## 2019-12-21 ENCOUNTER — Emergency Department: Payer: No Typology Code available for payment source

## 2019-12-21 ENCOUNTER — Inpatient Hospital Stay
Admission: EM | Admit: 2019-12-21 | Discharge: 2019-12-24 | DRG: 689 | Disposition: A | Discharge status: Skilled Nursing Facility | Payer: No Typology Code available for payment source | Attending: Internal Medicine | Admitting: Internal Medicine

## 2019-12-21 DIAGNOSIS — Z931 Gastrostomy status: Secondary | ICD-10-CM | POA: Diagnosis not present

## 2019-12-21 DIAGNOSIS — N179 Acute kidney failure, unspecified: Secondary | ICD-10-CM | POA: Diagnosis present

## 2019-12-21 DIAGNOSIS — F0390 Unspecified dementia without behavioral disturbance: Secondary | ICD-10-CM | POA: Diagnosis present

## 2019-12-21 DIAGNOSIS — E119 Type 2 diabetes mellitus without complications: Secondary | ICD-10-CM | POA: Diagnosis present

## 2019-12-21 DIAGNOSIS — Z9981 Dependence on supplemental oxygen: Secondary | ICD-10-CM

## 2019-12-21 DIAGNOSIS — K9423 Gastrostomy malfunction: Secondary | ICD-10-CM | POA: Diagnosis present

## 2019-12-21 DIAGNOSIS — M1990 Unspecified osteoarthritis, unspecified site: Secondary | ICD-10-CM | POA: Diagnosis present

## 2019-12-21 DIAGNOSIS — Z888 Allergy status to other drugs, medicaments and biological substances status: Secondary | ICD-10-CM

## 2019-12-21 DIAGNOSIS — Z8249 Family history of ischemic heart disease and other diseases of the circulatory system: Secondary | ICD-10-CM

## 2019-12-21 DIAGNOSIS — R05 Cough: Secondary | ICD-10-CM

## 2019-12-21 DIAGNOSIS — F39 Unspecified mood [affective] disorder: Secondary | ICD-10-CM | POA: Diagnosis present

## 2019-12-21 DIAGNOSIS — I10 Essential (primary) hypertension: Secondary | ICD-10-CM | POA: Diagnosis present

## 2019-12-21 DIAGNOSIS — Z833 Family history of diabetes mellitus: Secondary | ICD-10-CM

## 2019-12-21 DIAGNOSIS — E785 Hyperlipidemia, unspecified: Secondary | ICD-10-CM | POA: Diagnosis present

## 2019-12-21 DIAGNOSIS — E876 Hypokalemia: Secondary | ICD-10-CM | POA: Diagnosis present

## 2019-12-21 DIAGNOSIS — G9341 Metabolic encephalopathy: Secondary | ICD-10-CM | POA: Diagnosis not present

## 2019-12-21 DIAGNOSIS — E875 Hyperkalemia: Secondary | ICD-10-CM | POA: Diagnosis present

## 2019-12-21 DIAGNOSIS — Z8546 Personal history of malignant neoplasm of prostate: Secondary | ICD-10-CM | POA: Diagnosis not present

## 2019-12-21 DIAGNOSIS — G9340 Encephalopathy, unspecified: Secondary | ICD-10-CM | POA: Diagnosis not present

## 2019-12-21 DIAGNOSIS — N3000 Acute cystitis without hematuria: Principal | ICD-10-CM | POA: Diagnosis present

## 2019-12-21 DIAGNOSIS — B961 Klebsiella pneumoniae [K. pneumoniae] as the cause of diseases classified elsewhere: Secondary | ICD-10-CM | POA: Diagnosis present

## 2019-12-21 DIAGNOSIS — Z794 Long term (current) use of insulin: Secondary | ICD-10-CM

## 2019-12-21 DIAGNOSIS — Z20822 Contact with and (suspected) exposure to covid-19: Secondary | ICD-10-CM | POA: Diagnosis present

## 2019-12-21 DIAGNOSIS — N390 Urinary tract infection, site not specified: Secondary | ICD-10-CM | POA: Diagnosis present

## 2019-12-21 DIAGNOSIS — I447 Left bundle-branch block, unspecified: Secondary | ICD-10-CM | POA: Diagnosis present

## 2019-12-21 DIAGNOSIS — Z79899 Other long term (current) drug therapy: Secondary | ICD-10-CM

## 2019-12-21 DIAGNOSIS — K219 Gastro-esophageal reflux disease without esophagitis: Secondary | ICD-10-CM | POA: Diagnosis present

## 2019-12-21 DIAGNOSIS — G500 Trigeminal neuralgia: Secondary | ICD-10-CM | POA: Diagnosis present

## 2019-12-21 DIAGNOSIS — R059 Cough, unspecified: Secondary | ICD-10-CM

## 2019-12-21 DIAGNOSIS — Z66 Do not resuscitate: Secondary | ICD-10-CM | POA: Diagnosis present

## 2019-12-21 DIAGNOSIS — F4310 Post-traumatic stress disorder, unspecified: Secondary | ICD-10-CM | POA: Diagnosis present

## 2019-12-21 DIAGNOSIS — L89152 Pressure ulcer of sacral region, stage 2: Secondary | ICD-10-CM

## 2019-12-21 DIAGNOSIS — Z8042 Family history of malignant neoplasm of prostate: Secondary | ICD-10-CM

## 2019-12-21 DIAGNOSIS — L8990 Pressure ulcer of unspecified site, unspecified stage: Secondary | ICD-10-CM | POA: Insufficient documentation

## 2019-12-21 DIAGNOSIS — F419 Anxiety disorder, unspecified: Secondary | ICD-10-CM | POA: Diagnosis present

## 2019-12-21 LAB — CBC WITH DIFFERENTIAL/PLATELET
Abs Immature Granulocytes: 0.03 10*3/uL (ref 0.00–0.07)
Basophils Absolute: 0.1 10*3/uL (ref 0.0–0.1)
Basophils Relative: 1 %
Eosinophils Absolute: 0.2 10*3/uL (ref 0.0–0.5)
Eosinophils Relative: 2 %
HCT: 32.7 % — ABNORMAL LOW (ref 39.0–52.0)
Hemoglobin: 10.6 g/dL — ABNORMAL LOW (ref 13.0–17.0)
Immature Granulocytes: 0 %
Lymphocytes Relative: 16 %
Lymphs Abs: 1.7 10*3/uL (ref 0.7–4.0)
MCH: 27.7 pg (ref 26.0–34.0)
MCHC: 32.4 g/dL (ref 30.0–36.0)
MCV: 85.4 fL (ref 80.0–100.0)
Monocytes Absolute: 1 10*3/uL (ref 0.1–1.0)
Monocytes Relative: 9 %
Neutro Abs: 7.9 10*3/uL — ABNORMAL HIGH (ref 1.7–7.7)
Neutrophils Relative %: 72 %
Platelets: 580 10*3/uL — ABNORMAL HIGH (ref 150–400)
RBC: 3.83 MIL/uL — ABNORMAL LOW (ref 4.22–5.81)
RDW: 15.4 % (ref 11.5–15.5)
WBC: 10.9 10*3/uL — ABNORMAL HIGH (ref 4.0–10.5)
nRBC: 0 % (ref 0.0–0.2)

## 2019-12-21 LAB — BASIC METABOLIC PANEL
Anion gap: 11 (ref 5–15)
BUN: 48 mg/dL — ABNORMAL HIGH (ref 8–23)
CO2: 27 mmol/L (ref 22–32)
Calcium: 8.3 mg/dL — ABNORMAL LOW (ref 8.9–10.3)
Chloride: 100 mmol/L (ref 98–111)
Creatinine, Ser: 1.48 mg/dL — ABNORMAL HIGH (ref 0.61–1.24)
GFR calc Af Amer: 53 mL/min — ABNORMAL LOW (ref >60)
GFR calc non Af Amer: 45 mL/min — ABNORMAL LOW (ref >60)
Glucose, Bld: 123 mg/dL — ABNORMAL HIGH (ref 70–99)
Potassium: 5.8 mmol/L — ABNORMAL HIGH (ref 3.5–5.1)
Sodium: 138 mmol/L (ref 135–145)

## 2019-12-21 LAB — COMPREHENSIVE METABOLIC PANEL
ALT: 12 U/L (ref 0–44)
AST: 13 U/L — ABNORMAL LOW (ref 15–41)
Albumin: 3.2 g/dL — ABNORMAL LOW (ref 3.5–5.0)
Alkaline Phosphatase: 82 U/L (ref 38–126)
Anion gap: 12 (ref 5–15)
BUN: 46 mg/dL — ABNORMAL HIGH (ref 8–23)
CO2: 25 mmol/L (ref 22–32)
Calcium: 8.6 mg/dL — ABNORMAL LOW (ref 8.9–10.3)
Chloride: 96 mmol/L — ABNORMAL LOW (ref 98–111)
Creatinine, Ser: 1.46 mg/dL — ABNORMAL HIGH (ref 0.61–1.24)
GFR calc Af Amer: 53 mL/min — ABNORMAL LOW (ref >60)
GFR calc non Af Amer: 46 mL/min — ABNORMAL LOW (ref >60)
Glucose, Bld: 135 mg/dL — ABNORMAL HIGH (ref 70–99)
Potassium: 5.9 mmol/L — ABNORMAL HIGH (ref 3.5–5.1)
Sodium: 133 mmol/L — ABNORMAL LOW (ref 135–145)
Total Bilirubin: 0.5 mg/dL (ref 0.3–1.2)
Total Protein: 8.1 g/dL (ref 6.5–8.1)

## 2019-12-21 LAB — GLUCOSE, CAPILLARY
Glucose-Capillary: 105 mg/dL — ABNORMAL HIGH (ref 70–99)
Glucose-Capillary: 110 mg/dL — ABNORMAL HIGH (ref 70–99)

## 2019-12-21 LAB — URINALYSIS, COMPLETE (UACMP) WITH MICROSCOPIC
Bilirubin Urine: NEGATIVE
Glucose, UA: NEGATIVE mg/dL
Hgb urine dipstick: NEGATIVE
Ketones, ur: NEGATIVE mg/dL
Nitrite: NEGATIVE
Protein, ur: 30 mg/dL — AB
Specific Gravity, Urine: 1.012 (ref 1.005–1.030)
WBC, UA: >50 WBC/hpf — ABNORMAL HIGH (ref 0–5)
pH: 7 (ref 5.0–8.0)

## 2019-12-21 LAB — POTASSIUM: Potassium: 5.6 mmol/L — ABNORMAL HIGH (ref 3.5–5.1)

## 2019-12-21 LAB — TROPONIN I (HIGH SENSITIVITY)
Troponin I (High Sensitivity): 7 ng/L (ref <18)
Troponin I (High Sensitivity): 5 ng/L (ref <18)

## 2019-12-21 LAB — SARS CORONAVIRUS 2 (TAT 6-24 HRS): SARS Coronavirus 2: NEGATIVE

## 2019-12-21 MED ORDER — INSULIN ASPART 100 UNIT/ML ~~LOC~~ SOLN: 0.0000 [IU] | Freq: Every day | SUBCUTANEOUS | Status: DC

## 2019-12-21 MED ORDER — SODIUM CHLORIDE 0.9 % IV SOLN: INTRAVENOUS | Status: DC

## 2019-12-21 MED ORDER — SODIUM CHLORIDE 0.9 % IV BOLUS
1000.0000 mL | Freq: Once | INTRAVENOUS | Status: AC
  Administered 2019-12-21: 03:00:00 1000 mL via INTRAVENOUS

## 2019-12-21 MED ORDER — MUPIROCIN 2 % EX OINT
1.0000 "application " | TOPICAL_OINTMENT | Freq: Every day | CUTANEOUS | Status: DC
  Administered 2019-12-24: 1 via NASAL
  Filled 2019-12-21 (×2): qty 22

## 2019-12-21 MED ORDER — DONEPEZIL HCL 5 MG PO TABS
10.0000 mg | ORAL_TABLET | Freq: Every day | ORAL | Status: DC
  Administered 2019-12-21 – 2019-12-23 (×3): 10 mg via ORAL
  Filled 2019-12-21 (×3): qty 2

## 2019-12-21 MED ORDER — NYSTATIN 100000 UNIT/ML MT SUSP
5.0000 mL | Freq: Four times a day (QID) | OROMUCOSAL | Status: DC
  Administered 2019-12-22 – 2019-12-24 (×10): 500000 [IU] via ORAL
  Filled 2019-12-21 (×11): qty 5

## 2019-12-21 MED ORDER — AMLODIPINE BESYLATE 10 MG PO TABS: 10.0000 mg | ORAL_TABLET | Freq: Every day | ORAL | Status: DC

## 2019-12-21 MED ORDER — CARBAMAZEPINE 200 MG PO TABS
200.0000 mg | ORAL_TABLET | Freq: Four times a day (QID) | ORAL | Status: DC
  Administered 2019-12-21: 22:00:00 200 mg via ORAL
  Filled 2019-12-21: qty 1

## 2019-12-21 MED ORDER — ALBUTEROL SULFATE (2.5 MG/3ML) 0.083% IN NEBU: 2.5000 mg | INHALATION_SOLUTION | RESPIRATORY_TRACT | Status: DC | PRN

## 2019-12-21 MED ORDER — LIDOCAINE 5 % EX PTCH
1.0000 | MEDICATED_PATCH | CUTANEOUS | Status: DC
  Administered 2019-12-22 – 2019-12-23 (×3): 1 via TRANSDERMAL
  Filled 2019-12-21 (×5): qty 1

## 2019-12-21 MED ORDER — VITAMIN D 25 MCG (1000 UNIT) PO TABS
1000.0000 [IU] | ORAL_TABLET | Freq: Every day | ORAL | Status: DC
  Administered 2019-12-22 – 2019-12-24 (×4): 1000 [IU] via ORAL
  Filled 2019-12-21 (×3): qty 1

## 2019-12-21 MED ORDER — LORAZEPAM 2 MG/ML IJ SOLN: 1.0000 mg | INTRAMUSCULAR | Status: DC | PRN

## 2019-12-21 MED ORDER — PANTOPRAZOLE SODIUM 20 MG PO TBEC
20.0000 mg | DELAYED_RELEASE_TABLET | Freq: Two times a day (BID) | ORAL | Status: DC
  Administered 2019-12-22 – 2019-12-24 (×5): 20 mg via ORAL
  Filled 2019-12-21 (×7): qty 1

## 2019-12-21 MED ORDER — CARBAMAZEPINE 100 MG PO CHEW
300.0000 mg | CHEWABLE_TABLET | Freq: Four times a day (QID) | ORAL | Status: DC
  Administered 2019-12-22 – 2019-12-24 (×10): 300 mg via ORAL
  Filled 2019-12-21 (×14): qty 3

## 2019-12-21 MED ORDER — ENOXAPARIN SODIUM 40 MG/0.4ML ~~LOC~~ SOLN
40.0000 mg | SUBCUTANEOUS | Status: DC
  Administered 2019-12-21 – 2019-12-23 (×3): 40 mg via SUBCUTANEOUS
  Filled 2019-12-21 (×3): qty 0.4

## 2019-12-21 MED ORDER — DULOXETINE HCL 30 MG PO CPEP
30.0000 mg | ORAL_CAPSULE | Freq: Every day | ORAL | Status: DC
  Administered 2019-12-22 – 2019-12-24 (×3): 30 mg via ORAL
  Filled 2019-12-21 (×4): qty 1

## 2019-12-21 MED ORDER — SODIUM ZIRCONIUM CYCLOSILICATE 10 G PO PACK
10.0000 g | PACK | Freq: Once | ORAL | Status: DC
  Filled 2019-12-21: qty 1

## 2019-12-21 MED ORDER — LIDOCAINE HCL 4 % EX CREA: TOPICAL_CREAM | Freq: Two times a day (BID) | CUTANEOUS | Status: DC

## 2019-12-21 MED ORDER — SODIUM CHLORIDE 0.9 % IV SOLN
1.0000 g | INTRAVENOUS | Status: DC
  Administered 2019-12-21 – 2019-12-23 (×3): 1 g via INTRAVENOUS
  Filled 2019-12-21: qty 10
  Filled 2019-12-21 (×2): qty 1

## 2019-12-21 MED ORDER — ATORVASTATIN CALCIUM 20 MG PO TABS
20.0000 mg | ORAL_TABLET | Freq: Every day | ORAL | Status: DC
  Administered 2019-12-21 – 2019-12-23 (×3): 20 mg via ORAL
  Filled 2019-12-21 (×3): qty 1

## 2019-12-21 MED ORDER — ONDANSETRON HCL 4 MG/2ML IJ SOLN: 4.0000 mg | Freq: Three times a day (TID) | INTRAMUSCULAR | Status: DC | PRN

## 2019-12-21 MED ORDER — ACETAMINOPHEN 325 MG PO TABS
650.0000 mg | ORAL_TABLET | Freq: Four times a day (QID) | ORAL | Status: DC | PRN
  Administered 2019-12-22 (×2): 650 mg via ORAL
  Filled 2019-12-21 (×2): qty 2

## 2019-12-21 MED ORDER — FUROSEMIDE 10 MG/ML IJ SOLN
20.0000 mg | Freq: Once | INTRAMUSCULAR | Status: AC
  Administered 2019-12-21: 03:00:00 20 mg via INTRAVENOUS
  Filled 2019-12-21: qty 4

## 2019-12-21 MED ORDER — TRAZODONE HCL 50 MG PO TABS
150.0000 mg | ORAL_TABLET | Freq: Every day | ORAL | Status: DC
  Administered 2019-12-21 – 2019-12-23 (×3): 150 mg via ORAL
  Filled 2019-12-21 (×3): qty 1

## 2019-12-21 MED ORDER — QUETIAPINE FUMARATE 25 MG PO TABS
25.0000 mg | ORAL_TABLET | Freq: Every day | ORAL | Status: DC
  Administered 2019-12-21 – 2019-12-23 (×3): 25 mg via ORAL
  Filled 2019-12-21 (×3): qty 1

## 2019-12-21 MED ORDER — GABAPENTIN 100 MG PO CAPS
200.0000 mg | ORAL_CAPSULE | Freq: Two times a day (BID) | ORAL | Status: DC
  Administered 2019-12-22 – 2019-12-24 (×6): 200 mg via ORAL
  Filled 2019-12-21 (×6): qty 2

## 2019-12-21 MED ORDER — SODIUM ZIRCONIUM CYCLOSILICATE 10 G PO PACK
10.0000 g | PACK | Freq: Once | ORAL | Status: AC
  Administered 2019-12-21: 15:00:00 10 g via ORAL
  Filled 2019-12-21: qty 1

## 2019-12-21 MED ORDER — MAGNESIUM OXIDE 400 (241.3 MG) MG PO TABS
400.0000 mg | ORAL_TABLET | Freq: Every day | ORAL | Status: DC
  Administered 2019-12-22 – 2019-12-24 (×4): 400 mg via ORAL
  Filled 2019-12-21 (×3): qty 1

## 2019-12-21 MED ORDER — INSULIN ASPART 100 UNIT/ML ~~LOC~~ SOLN
0.0000 [IU] | Freq: Three times a day (TID) | SUBCUTANEOUS | Status: DC
  Administered 2019-12-22: 12:00:00 1 [IU] via SUBCUTANEOUS
  Administered 2019-12-22: 2 [IU] via SUBCUTANEOUS
  Administered 2019-12-23: 17:00:00 1 [IU] via SUBCUTANEOUS
  Administered 2019-12-23: 3 [IU] via SUBCUTANEOUS
  Administered 2019-12-24: 1 [IU] via SUBCUTANEOUS
  Filled 2019-12-21 (×5): qty 1

## 2019-12-21 MED ORDER — INSULIN ASPART 100 UNIT/ML ~~LOC~~ SOLN
10.0000 [IU] | Freq: Once | SUBCUTANEOUS | Status: AC
  Administered 2019-12-21: 07:00:00 10 [IU] via INTRAVENOUS
  Filled 2019-12-21: qty 1

## 2019-12-21 MED ORDER — ACETAMINOPHEN 650 MG RE SUPP: 650.0000 mg | Freq: Four times a day (QID) | RECTAL | Status: DC | PRN

## 2019-12-21 MED ORDER — SODIUM BICARBONATE 8.4 % IV SOLN
50.0000 meq | Freq: Once | INTRAVENOUS | Status: AC
  Administered 2019-12-21: 07:00:00 50 meq via INTRAVENOUS
  Filled 2019-12-21: qty 50

## 2019-12-21 MED ORDER — FREE WATER
150.0000 mL | Status: DC
  Administered 2019-12-22 (×2): 150 mL
  Filled 2019-12-21 (×4): qty 150

## 2019-12-21 MED ORDER — DEXTROSE 50 % IV SOLN
1.0000 | Freq: Once | INTRAVENOUS | Status: AC
  Administered 2019-12-21: 07:00:00 50 mL via INTRAVENOUS
  Filled 2019-12-21: qty 50

## 2019-12-21 MED ORDER — LATANOPROST 0.005 % OP SOLN
1.0000 [drp] | Freq: Every day | OPHTHALMIC | Status: DC
  Administered 2019-12-22: 23:00:00 1 [drp] via OPHTHALMIC
  Filled 2019-12-21: qty 2.5

## 2019-12-21 MED ORDER — ALPRAZOLAM 0.5 MG PO TABS: 0.5000 mg | ORAL_TABLET | Freq: Three times a day (TID) | ORAL | Status: DC | PRN

## 2019-12-21 MED ORDER — LACOSAMIDE 50 MG PO TABS
150.0000 mg | ORAL_TABLET | Freq: Two times a day (BID) | ORAL | Status: DC
  Administered 2019-12-21 – 2019-12-24 (×6): 150 mg via ORAL
  Filled 2019-12-21 (×6): qty 3

## 2019-12-21 MED ORDER — HYDRALAZINE HCL 25 MG PO TABS: 25.0000 mg | ORAL_TABLET | Freq: Three times a day (TID) | ORAL | Status: DC | PRN

## 2019-12-21 MED ORDER — JEVITY 1.5 CAL/FIBER PO LIQD: 1000.0000 mL | ORAL | Status: DC

## 2019-12-21 MED ORDER — SODIUM CHLORIDE 0.9 % IV BOLUS
1000.0000 mL | Freq: Once | INTRAVENOUS | Status: AC
  Administered 2019-12-21: 04:00:00 1000 mL via INTRAVENOUS

## 2019-12-21 NOTE — ED Triage Notes (Signed)
BIB EMS from [facility] 15 min seizure activity after g tube placement. Per ems they noticed generalized jerkings  AND was given 2 mg versed. 100-275ml of NS. Has 20 G LAC    Pt is normally confused per facility. initial bp N77/44, O2 89% RA  HR 120 SINUS. Normally ON 2L o2 but EMS placed pt on 4L to increase to 96%.   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

## 2019-12-21 NOTE — ED Notes (Signed)
Pts brief changed at this time.

## 2019-12-21 NOTE — ED Notes (Signed)
Pt noted to have ripped IV out of arm. This RN cleaned area and reinserted new peripheral IV. Pt gown was soiled, so this RN changed pt's gown as well.

## 2019-12-21 NOTE — H&P (Signed)
History and Physical    Chad Allen [id number] DOB: May 01, 1941 DOA: 12/21/2019  Referring MD/NP/PA:   PCP: Alvester Morin, MD   Patient coming from:  The patient is coming from SNF.  At baseline, pt is dependent for most of ADL.        Respiratory distress, cough, nausea, vomiting: AMS  HPI: Chad Allen is a 79 y.o. male with medical history significant of hypertension, hyperlipidemia, diabetes mellitus, GERD, trigeminal neuralgia, PTSD, mood disorder, feeding G-tube placement, dementia, on 2L nasal cannula oxygen, who presents with altered mental status.  Per EDP, initially pt was brought to ED for evaluation for possible seizure, but ED physician Dr.Veronese spoke with nurse at the nursing home, " who said that she walked in the room and patient was foaming through the mouth and shaking.  He was staring at the ceiling but would answer her questions by shaking his head yes or no.  He was never unresponsive.  She reports that she notices oxygen had dropped to the 70s so she increased his oxygen to 4 L.  He remained hypoxic which prompted the call to 911.  When EMS arrived patient was satting well on his baseline 2 L oxygen.  Had normal blood glucose.  Patient was hypotensive with systolics in the [id number] and received 200 cc of fluid". Per EDP, pt was able to communicate by shaking his head yes and no. At arrival to the emergency room, patient is alert and oriented x3, able to fully answer questions about his medical history. Per EDP, pt did not seem to have seizure. When I saw pt in ED, pt is alert and mildly confused, moves all extremities.  Patient has bilateral hand tremor.  Patient states that he has pain all over. No active nausea, vomiting, diarrhea, respiratory distress or cough noted.  ED Course: pt was found to have potassium of 5.9 -->5.8, AKI with creatinine 1.48, BUN 48, WBC 10.9, pending COVID-19 PCR, troponin 7, positive UA for UTI (cloudy appearance, large amount  of leukocyte, rare bacteria, WBC >50), temperature normal, blood pressure 131/81, tachycardia, oxygen saturation 92 to 100% on 2 L home level of oxygen, chest x-ray negative. Pt is admitted to med-surg bed as inpt.  # CT-head showed: 1. Generalized atrophy and chronic small vessel ischemia. 2. Ventricular enlargement likely due to central atrophy, normal pressure hydrocephalus is difficult to exclude on imaging findings alone.  Review of Systems:   General: no fevers, chills, no body weight gain, has fatigue. Has pain all over. HEENT: no blurry vision, hearing changes or sore throat Respiratory: no dyspnea, coughing, wheezing CV: no chest pain, no palpitations GI: no nausea, vomiting, abdominal pain, diarrhea, constipation GU: no dysuria, burning on urination, increased urinary frequency, hematuria  Ext: has trace leg edema Neuro: no unilateral weakness, numbness, or tingling, no vision change or hearing loss. Has AMS. Skin: no rash, no skin tear. MSK: No muscle spasm, no deformity, no limitation of range of movement in spin Heme: No easy bruising.  Travel history: No recent long distant travel.  Allergy:  Allergies  Allergen Reactions  . Lisinopril Swelling  . Amitriptyline   . Celexa [Citalopram]   . Fluoxetine Other (See Comments)    Tremors  . Paroxetine   . Zoloft [Sertraline]     Past Medical History:  Diagnosis Date  . Abnormal posture   . Dementia ([place])   . Dependence on continuous supplemental oxygen    2L O2 by [place]  . DNR (do not resuscitate)   .  Feeding by G-tube ([place])   . Generalized muscle weakness   . GERD (gastroesophageal reflux disease)   . Hyperlipidemia   . Hypertension   . Malignant neoplasm of prostate ([place])   . Mood disorder ([place])   . Osteoarthritis   . PTSD (post-traumatic stress disorder)   . Trigeminal neuralgia   . Type 2 diabetes mellitus without complications ([place])   . Unsteadiness on feet     Past Surgical History:  Procedure  Laterality Date  . GASTROSTOMY      Social History:  has no history on file for tobacco, alcohol, and drug.  Could not be reviewed accurately due to dementia and AMS Family History: History reviewed. No pertinent family history.  Could not be reviewed accurately due to dementia and altered mental status.  Prior to Admission medications   Medication Sig Start Date End Date Taking? Authorizing Provider  acetaminophen (TYLENOL) 325 MG tablet Take 650 mg by mouth every 6 (six) hours as needed.   Yes [provider]  ALPRAZolam (XANAX) 0.5 MG tablet Take 0.5 mg by mouth every 8 (eight) hours as needed for anxiety.   Yes [provider]  amLODipine (NORVASC) 10 MG tablet Take 1 tablet (10 mg total) by mouth daily. Patient taking differently: Take 10 mg by mouth daily. HOLD FOR SBP<110 AND HR<60 12/10/19  Yes Nicole Kindred A, DO  atorvastatin (LIPITOR) 20 MG tablet Take 20 mg by mouth at bedtime.   Yes [provider]  carbamazepine (TEGRETOL) 100 MG chewable tablet Chew 100 mg by mouth 4 (four) times daily. Take with 200mg  tablet for a total dose of 300mg  daily.   Yes [provider]  carbamazepine (TEGRETOL) 200 MG tablet Take 200 mg by mouth 4 (four) times daily. Take with 100mg  tablet for a total dose of 300mg  daily   Yes [provider]  Cholecalciferol (D3-1000) 25 MCG (1000 UT) capsule Take 1,000 Units by mouth daily.   Yes [provider]  donepezil (ARICEPT) 10 MG tablet Take 10 mg by mouth at bedtime.   Yes [provider]  DULoxetine (CYMBALTA) 30 MG capsule Take 30 mg by mouth daily.   Yes [provider]  gabapentin (NEURONTIN) 100 MG capsule Take 2 capsules (200 mg total) by mouth 2 (two) times daily. 12/09/19 12/08/20 Yes Nicole Kindred A, DO  insulin aspart (NOVOLOG) 100 UNIT/ML injection Inject 0-9 Units into the skin every 4 (four) hours. Patient taking differently: Inject 0-12 Units into the skin every 4  (four) hours. Sliding scale 12/09/19  Yes Ezekiel Slocumb, DO  Lacosamide (VIMPAT) 150 MG TABS Take 150 mg by mouth 2 (two) times daily.    Yes [provider]  latanoprost (XALATAN) 0.005 % ophthalmic solution Place 1 drop into both eyes at bedtime.   Yes [provider]  Lidocaine HCl (ASPERCREME W/LIDOCAINE) 4 % CREA Apply topically 2 (two) times daily. Apply to lower back twice daily for pain   Yes [provider]  losartan (COZAAR) 50 MG tablet Take 1 tablet (50 mg total) by mouth daily. 12/10/19  Yes Nicole Kindred A, DO  magnesium oxide (MAG-OX) 400 MG tablet Take 400 mg by mouth daily.   Yes [provider]  metFORMIN (GLUCOPHAGE) 500 MG tablet Take 250 mg by mouth 2 (two) times daily with a meal.   Yes [provider]  mupirocin ointment (BACTROBAN) 2 % Place 1 application into the nose daily. Apply to PEG tube insertion site following cleansing with  wound cleanser daily until healed.   Yes [provider]  Nutritional Supplements (FEEDING SUPPLEMENT, JEVITY 1.5 CAL/FIBER,) LIQD Place 1,000 mLs into feeding tube continuous. 12/09/19  Yes Nicole Kindred A, DO  nystatin (MYCOSTATIN) 100000 UNIT/ML suspension Take 5 mLs (500,000 Units total) by mouth 4 (four) times daily. 12/09/19  Yes Nicole Kindred A, DO  pantoprazole (PROTONIX) 20 MG tablet Take 20 mg by mouth 2 (two) times daily.   Yes [provider]  QUEtiapine (SEROQUEL) 25 MG tablet Take 25 mg by mouth at bedtime.   Yes [provider]  traZODone (DESYREL) 150 MG tablet Take 150 mg by mouth at bedtime.   Yes [provider]  Water For Irrigation, Sterile (FREE WATER) SOLN Place 150 mLs into feeding tube every 4 (four) hours. 12/09/19  Yes Ezekiel Slocumb, DO    Physical Exam: Vitals:   12/21/19 0700 12/21/19 0830 12/21/19 0900 12/21/19 1000  BP: 136/64 124/72 127/60 120/67  Pulse: 97 88 88 88  Resp: 17 14 19 17   Temp:      TempSrc:      SpO2:  98% 96% 100% 97%   General: Not in acute distress HEENT:       Eyes: PERRL, EOMI, no scleral icterus.       ENT: No discharge from the ears and nose, no pharynx injection, no tonsillar enlargement.        Neck: No JVD, no bruit, no mass felt. Heme: No neck lymph node enlargement. Cardiac: S1/S2, RRR, No murmurs, No gallops or rubs. Respiratory:  No rales, wheezing, rhonchi or rubs. GI: Soft, nondistended, nontender, no organomegaly, BS present. S/p of feeding G-tube placement GU: No hematuria Ext: has trace leg edema bilaterally. 1+DP/PT pulse bilaterally. Musculoskeletal: No joint deformities, No joint redness or warmth, no limitation of ROM in spin. Skin: No rashes.  Neuro: mildly confused, knows his own name, oriented to place, but not time. cranial nerves II-XII grossly intact, moves all extremities. Has bilateral hand tremor Psych: Patient is not psychotic, no suicidal or hemocidal ideation.  Labs on Admission: I have personally reviewed following labs and imaging studies  CBC: Recent Labs  Lab 12/21/19 0118  WBC 10.9*  NEUTROABS 7.9*  HGB 10.6*  HCT 32.7*  MCV 85.4  PLT 123456*   Basic Metabolic Panel: Recent Labs  Lab 12/21/19 0118 12/21/19 0537 12/21/19 1045  NA 133* 138  --   K 5.9* 5.8* 5.6*  CL 96* 100  --   CO2 25 27  --   GLUCOSE 135* 123*  --   BUN 46* 48*  --   CREATININE 1.46* 1.48*  --   CALCIUM 8.6* 8.3*  --    GFR: Estimated Creatinine Clearance: 48.8 mL/min (A) (by C-G formula based on SCr of 1.48 mg/dL (H)). Liver Function Tests: Recent Labs  Lab 12/21/19 0118  AST 13*  ALT 12  ALKPHOS 82  BILITOT 0.5  PROT 8.1  ALBUMIN 3.2*   No results for input(s): LIPASE, AMYLASE in the last 168 hours. No results for input(s): AMMONIA in the last 168 hours. Coagulation Profile: No results for input(s): INR, PROTIME in the last 168 hours. Cardiac Enzymes: No results for input(s): CKTOTAL, CKMB, CKMBINDEX, TROPONINI in the last 168 hours. BNP (last  3 results) No results for input(s): PROBNP in the last 8760 hours. HbA1C: No results for input(s): HGBA1C in the last 72 hours. CBG: Recent Labs  Lab 12/21/19 1416  GLUCAP 105*   Lipid Profile: No results  for input(s): CHOL, HDL, LDLCALC, TRIG, CHOLHDL, LDLDIRECT in the last 72 hours. Thyroid Function Tests: No results for input(s): TSH, T4TOTAL, FREET4, T3FREE, THYROIDAB in the last 72 hours. Anemia Panel: No results for input(s): VITAMINB12, FOLATE, FERRITIN, TIBC, IRON, RETICCTPCT in the last 72 hours. Urine analysis:    Component Value Date/Time   COLORURINE YELLOW (A) 12/21/2019 0324   APPEARANCEUR CLOUDY (A) 12/21/2019 0324   LABSPEC 1.012 12/21/2019 0324   PHURINE 7.0 12/21/2019 0324   GLUCOSEU NEGATIVE 12/21/2019 0324   HGBUR NEGATIVE 12/21/2019 0324   BILIRUBINUR NEGATIVE 12/21/2019 0324   KETONESUR NEGATIVE 12/21/2019 0324   PROTEINUR 30 (A) 12/21/2019 0324   NITRITE NEGATIVE 12/21/2019 0324   LEUKOCYTESUR LARGE (A) 12/21/2019 0324   Sepsis Labs: @LABRCNTIP (procalcitonin:4,lacticidven:4) )No results found for this or any previous visit (from the past 240 hour(s)).   Radiological Exams on Admission: CT Head Wo Contrast  Result Date: 12/21/2019 CLINICAL DATA:  Seizure, nontraumatic (Age >= 87y) EXAM: CT HEAD WITHOUT CONTRAST TECHNIQUE: Contiguous axial images were obtained from the base of the skull through the vertex without intravenous contrast. COMPARISON:  None. FINDINGS: Brain: No intracranial hemorrhage, mass effect, or midline shift. Generalized atrophy. Ventricular enlargement likely due to central atrophy. Cavum septum pellucidum. The basilar cisterns are patent. Moderate periventricular white matter hypodensity most consistent with chronic small vessel ischemia. Question of periventricular lacunar infarcts. No evidence of territorial infarct or acute ischemia. No extra-axial or intracranial fluid collection. Vascular: Atherosclerosis of skullbase vasculature  without hyperdense vessel or abnormal calcification. Skull: No fracture or focal lesion. Sinuses/Orbits: Paranasal sinuses and mastoid air cells are clear. The visualized orbits are unremarkable. Other: None. IMPRESSION: 1. Generalized atrophy and chronic small vessel ischemia. 2. Ventricular enlargement likely due to central atrophy, normal pressure hydrocephalus is difficult to exclude on imaging findings alone. Electronically Signed   By: Keith Rake M.D.   On: 12/21/2019 01:55   DG Chest Portable 1 View  Result Date: 12/21/2019 CLINICAL DATA:  Hypoxia EXAM: PORTABLE CHEST 1 VIEW COMPARISON:  12/04/2019 FINDINGS: Streaky basilar opacities without significant change. No interval consolidation or pleural effusion. Stable cardiomediastinal silhouette. No pneumothorax. IMPRESSION: No significant interval change in streaky bibasilar opacities. No new infiltrate or edema. Electronically Signed   By: Donavan Foil M.D.   On: 12/21/2019 02:31     EKG: Independently reviewed.  Sinus rhythm, QTC 455, no T wave elevation, left bundle blockage  Assessment/Plan Principal Problem:   Acute metabolic encephalopathy Active Problems:   UTI (urinary tract infection)   Gastrostomy status (HCC)   Dementia without behavioral disturbance (HCC)   Essential hypertension   Mood disorder (HCC)   DNR (do not resuscitate)   GERD (gastroesophageal reflux disease)   AKI (acute kidney injury) ([place])   Hyperkalemia   LBBB (left bundle branch block)   Acute metabolic encephalopathy: Etiology is not clear.  Initially suspected seizure, but clinically does not seem to have seizure though cannot completely rule out this possibility. Pt dose not have seizure on medical problem list, but he is on Vimpat.  Possibly due to UTI.  -Admit to MedSurg bed as inpatient -Frequent neuro check -Get EEG -Seizure precaution -When necessary Ativan for seizure -Continue Vimpat  UTI (urinary tract infection): -on Rocephin  -f.u  Bx and Ux  Gastrostomy status ([place]): pt seems not using the G-tube -will give dysphagia 1 diet  Trigeminal neuralgia: -on gabapentin and Tegretol  Dementia without behavioral disturbance ([place]): - Aricept  Mood disorder ([place]) -continue Seroquel, Cymbalta, and  xanax  HTN:  -Continue home medications: Amlodipine -hold Cozaar due to AKI -hydralazine prn  GERD (gastroesophageal reflux disease): -PPI  AKI (acute kidney injury) ([place]):  -hold cozarr -IVF: 2L NS, then  75 cc/h  Hyperkalemia:  K 5.9 which was treated with D50, NovoLog, bicarbonate, Lasix, and 10 g Lokelma. The repeated test showed K 5.4 -will give 10 g of Lokelma again -f/u by BMP  LBBB (left bundle branch block): Does not seem to have chest pain.  Initial troponin 7 - trend troponin   Inpatient status:  # Patient requires inpatient status due to high intensity of service, high risk for further deterioration and high frequency of surveillance required.  I certify that at the point of admission it is my clinical judgment that the patient will require inpatient hospital care spanning beyond 2 midnights from the point of admission.  . This patient has multiple chronic comorbidities including hypertension, hyperlipidemia, diabetes mellitus, GERD, trigeminal neuralgia, PTSD, mood disorder, feeding G-tube placement, dementia, on 2L nasal cannula oxygen . Now patient has presenting with AMS, UTI, AKI, hyperkalemia, left bundle blockade . The worrisome physical exam findings include trace leg edema bilaterally, confusion, bilateral hand tremor . The initial radiographic and laboratory data are worrisome because of positive urinalysis for UTI, hypokalemia, AKI. Marland Kitchen Current medical needs: please see my assessment and plan . Predictability of an adverse outcome (risk): Patient has multiple comorbidities as listed above. Now presents with AMS, UTI, AKI, hyperkalemia, left bundle blockade. His presentation is highly complicated.   Patient is at high risk of deteriorating.  Will need to be treated in hospital for at least 2 days.     DVT ppx: SQ Lovenox Code Status: DNR (pt has DNR document from facility) Family Communication: None at bed side.     Disposition Plan:  Anticipate discharge back to previous SNF Consults called:  none Admission status: Med-surg bed as inpt   Date of Service 12/21/2019    Ivor Costa [facility]   If 7PM-7AM, please contact night-coverage [email or web address] Password [facility] 12/21/2019, 4:23 PM

## 2019-12-21 NOTE — ED Notes (Signed)
Pt positioned on right side.

## 2019-12-21 NOTE — ED Provider Notes (Signed)
[facility] Emergency Department Provider Note  ____________________________________________  Time seen: Approximately 1:31 AM  I have reviewed the triage vital signs and the nursing notes.   HISTORY  Chief Complaint Seizures   HPI Chad Allen is a 79 y.o. male with a history of dementia, chronic respiratory failure on 2 L nasal cannula, feeding tube, generalized muscle weakness, GERD, hypertension, hyperlipidemia, trigeminal neuralgia, diabetes, prostate cancer who presents for evaluation of seizure.   Per EMS patient was found having generalized tonic-clonic seizure per staff of his nursing home.  Initially patient was nonverbal per EMS.  Upon arrival to the emergency room is alert and oriented x 3, able to answer questions, GCS 15.  Patient denies any medical complaints, no headache, no fever, no body aches, no chills, no cough or shortness of breath, no chest pain, no nausea or vomiting, no abdominal pain.  I spoke with patient's nurse at the nursing home who said that she walked in the room and patient was foaming through the mouth and shaking.  He was staring at the ceiling but would answer her questions by shaking his head yes or no.  He was never unresponsive.  She reports that she notices oxygen had dropped to the 70s so she increased his oxygen to 4 L.  He remained hypoxic which prompted the call to 911.  When EMS arrived patient was satting well on his baseline 2 L oxygen.  Had normal blood glucose.  Patient was hypotensive with systolics in the [id number] and received 200 cc of fluid.  Past Medical History:  Diagnosis Date  . Abnormal posture   . Dementia ([place])   . Dependence on continuous supplemental oxygen    2L O2 by [place]  . DNR (do not resuscitate)   . Feeding by G-tube ([place])   . Generalized muscle weakness   . GERD (gastroesophageal reflux disease)   . Hyperlipidemia   . Hypertension   . Malignant neoplasm of prostate ([place])   . Mood disorder  ([place])   . Osteoarthritis   . PTSD (post-traumatic stress disorder)   . Trigeminal neuralgia   . Type 2 diabetes mellitus without complications ([place])   . Unsteadiness on feet     Patient Active Problem List   Diagnosis Date Noted  . Hypokalemia 12/06/2019  . Hypomagnesemia 12/06/2019  . Mouth pain 12/06/2019  . Acute metabolic encephalopathy [id number]  . Sepsis ([place]) 12/05/2019  . UTI (urinary tract infection) 12/05/2019  . 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 Dementia without behavioral disturbance ([place]) 12/05/2019  . Acute on chronic respiratory failure with hypoxia ([place]) 12/05/2019  . Essential hypertension 12/05/2019    Past Surgical History:  Procedure Laterality Date  . GASTROSTOMY      Prior to Admission medications   Medication Sig Start Date End Date Taking? Authorizing Provider  acetaminophen (TYLENOL) 325 MG tablet Take 650 mg by mouth every 6 (six) hours as needed.    [provider]  ALPRAZolam Duanne Moron) 0.5 MG tablet Take 0.5 mg by mouth every 8 (eight) hours as needed for anxiety.    [provider]  amLODipine (NORVASC) 10 MG tablet Take 1 tablet (10 mg total) by mouth daily. 12/10/19   Ezekiel Slocumb, DO  atorvastatin (LIPITOR) 20 MG tablet Take 20 mg by mouth at bedtime.    [provider]  carbamazepine (TEGRETOL) 100 MG chewable tablet Chew 100 mg by mouth 4 (four) times daily. Take with 200mg  tablet for a total dose of  300mg  daily.    [provider]  carbamazepine (TEGRETOL) 200 MG tablet Take 200 mg by mouth 4 (four) times daily. Take with 100mg  tablet for a total dose of 300mg  daily    [provider]  Cholecalciferol (D3-1000) 25 MCG (1000 UT) capsule Take 1,000 Units by mouth daily.    [provider]  donepezil (ARICEPT) 10 MG tablet Take 10 mg by mouth at bedtime.    [provider]  DULoxetine (CYMBALTA) 30 MG capsule Take 30 mg by mouth daily.    [provider]  gabapentin  (NEURONTIN) 100 MG capsule Take 2 capsules (200 mg total) by mouth 2 (two) times daily. 12/09/19 12/08/20  Nicole Kindred A, DO  insulin aspart (NOVOLOG) 100 UNIT/ML injection Inject 2 Units into the skin every 4 (four) hours. 12/09/19   Nicole Kindred A, DO  insulin aspart (NOVOLOG) 100 UNIT/ML injection Inject 0-9 Units into the skin every 4 (four) hours. 12/09/19   Ezekiel Slocumb, DO  lacosamide (VIMPAT) 50 MG TABS tablet Take 150 mg by mouth 2 (two) times daily.    [provider]  latanoprost (XALATAN) 0.005 % ophthalmic solution Place 1 drop into both eyes at bedtime.    [provider]  Lidocaine HCl (ASPERCREME W/LIDOCAINE) 4 % CREA Apply topically 2 (two) times daily. Apply to lower back twice daily for pain    [provider]  losartan (COZAAR) 50 MG tablet Take 1 tablet (50 mg total) by mouth daily. 12/10/19   Nicole Kindred A, DO  magnesium oxide (MAG-OX) 400 MG tablet Take 400 mg by mouth daily.    [provider]  metFORMIN (GLUCOPHAGE) 500 MG tablet Take 250 mg by mouth 2 (two) times daily with a meal.    [provider]  mupirocin ointment (BACTROBAN) 2 % Place 1 application into the nose daily. Apply to PEG tube insertion site following cleansing with wound cleanser daily until healed.    [provider]  Nutritional Supplements (FEEDING SUPPLEMENT, JEVITY 1.5 CAL/FIBER,) LIQD Place 1,000 mLs into feeding tube continuous. 12/09/19   Ezekiel Slocumb, DO  nystatin (MYCOSTATIN) 100000 UNIT/ML suspension Take 5 mLs (500,000 Units total) by mouth 4 (four) times daily. 12/09/19   Nicole Kindred A, DO  pantoprazole (PROTONIX) 20 MG tablet Take 20 mg by mouth 2 (two) times daily.    [provider]  QUEtiapine (SEROQUEL) 25 MG tablet Take 25 mg by mouth at bedtime.    [provider]  traZODone (DESYREL) 150 MG tablet Take 150 mg by mouth at bedtime.    [provider]  Water For Irrigation, Sterile  (FREE WATER) SOLN Place 150 mLs into feeding tube every 4 (four) hours. 12/09/19   Nicole Kindred A, DO    Allergies Lisinopril  History reviewed. No pertinent family history.  Social History Social History   Tobacco Use  . Smoking status: Unknown If Ever Smoked  Substance Use Topics  . Alcohol use: Not on file  . Drug use: Not on file    Review of Systems  Constitutional: Negative for fever. + tremors Eyes: Negative for visual changes. ENT: Negative for sore throat. Neck: No neck pain  Cardiovascular: Negative for chest pain. Respiratory: Negative for shortness of breath. Gastrointestinal: Negative for abdominal pain, vomiting or diarrhea. Genitourinary: Negative for dysuria. Musculoskeletal: Negative for back pain. Skin: Negative for rash. Neurological: Negative for headaches, weakness or numbness. Psych: No SI or HI  ____________________________________________   PHYSICAL EXAM:  VITAL  SIGNS: ED Triage Vitals  Enc Vitals Group     BP 12/21/19 0108 122/82     Pulse Rate 12/21/19 0108 (!) 106     Resp 12/21/19 0108 19     Temp 12/21/19 0108 98 F (36.7 C)     Temp Source 12/21/19 0108 Oral     SpO2 12/21/19 0108 100 %     Weight --      Height --      Head Circumference --      Peak Flow --      Pain Score 12/21/19 0059 0     Pain Loc --      Pain Edu? --      Excl. in [place]? --     Constitutional: Alert and oriented x 3, no apparent distress. HEENT:      Head: Normocephalic and atraumatic.         Eyes: Conjunctivae are normal. Sclera is non-icteric.       Mouth/Throat: Mucous membranes are moist.       Neck: Supple with no signs of meningismus. Cardiovascular: Tachycardic with regular rhythm. Respiratory: Normal respiratory effort. Lungs are clear to auscultation bilaterally. No wheezes, crackles, or rhonchi.  Gastrointestinal: Soft, non tender, and non distended with positive bowel sounds. No rebound or guarding. Musculoskeletal: Nontender with  normal range of motion in all extremities. No edema, cyanosis, or erythema of extremities. Neurologic: Normal speech and language. Face is symmetric. Intact strength on b/l LE, decrease symmetric strength on b/l LE. Skin: Skin is warm, dry and intact. No rash noted. Psychiatric: Mood and affect are normal. Speech and behavior are normal.  ____________________________________________   LABS (all labs ordered are listed, but only abnormal results are displayed)  Labs Reviewed  CBC WITH DIFFERENTIAL/PLATELET - Abnormal; Notable for the following components:      Result Value   WBC 10.9 (*)    RBC 3.83 (*)    Hemoglobin 10.6 (*)    HCT 32.7 (*)    Platelets 580 (*)    Neutro Abs 7.9 (*)    All other components within normal limits  COMPREHENSIVE METABOLIC PANEL - Abnormal; Notable for the following components:   Sodium 133 (*)    Potassium 5.9 (*)    Chloride 96 (*)    Glucose, Bld 135 (*)    BUN 46 (*)    Creatinine, Ser 1.46 (*)    Calcium 8.6 (*)    Albumin 3.2 (*)    AST 13 (*)    GFR calc non Af Amer 46 (*)    GFR calc Af Amer 53 (*)    All other components within normal limits  URINALYSIS, COMPLETE (UACMP) WITH MICROSCOPIC - Abnormal; Notable for the following components:   Color, Urine YELLOW (*)    APPearance CLOUDY (*)    Protein, ur 30 (*)    Leukocytes,Ua LARGE (*)    WBC, UA >50 (*)    Bacteria, UA RARE (*)    All other components within normal limits  BASIC METABOLIC PANEL - Abnormal; Notable for the following components:   Potassium 5.8 (*)    Glucose, Bld 123 (*)    BUN 48 (*)    Creatinine, Ser 1.48 (*)    Calcium 8.3 (*)    GFR calc non Af Amer 45 (*)    GFR calc Af Amer 53 (*)    All other components within normal limits  URINE CULTURE   ____________________________________________  EKG  ED ECG  REPORT I, Rudene Re, the attending physician, personally viewed and interpreted this ECG.  Sinus tachycardia left bundle branch block, rate of  105, normal QTC, normal axis, no ST elevations or depressions.  Unchanged from prior. ____________________________________________  RADIOLOGY  I have personally reviewed the images performed during this visit and I agree with the Radiologist's read.   Interpretation by Radiologist:  CT Head Wo Contrast  Result Date: 12/21/2019 CLINICAL DATA:  Seizure, nontraumatic (Age >= 13y) EXAM: CT HEAD WITHOUT CONTRAST TECHNIQUE: Contiguous axial images were obtained from the base of the skull through the vertex without intravenous contrast. COMPARISON:  None. FINDINGS: Brain: No intracranial hemorrhage, mass effect, or midline shift. Generalized atrophy. Ventricular enlargement likely due to central atrophy. Cavum septum pellucidum. The basilar cisterns are patent. Moderate periventricular white matter hypodensity most consistent with chronic small vessel ischemia. Question of periventricular lacunar infarcts. No evidence of territorial infarct or acute ischemia. No extra-axial or intracranial fluid collection. Vascular: Atherosclerosis of skullbase vasculature without hyperdense vessel or abnormal calcification. Skull: No fracture or focal lesion. Sinuses/Orbits: Paranasal sinuses and mastoid air cells are clear. The visualized orbits are unremarkable. Other: None. IMPRESSION: 1. Generalized atrophy and chronic small vessel ischemia. 2. Ventricular enlargement likely due to central atrophy, normal pressure hydrocephalus is difficult to exclude on imaging findings alone. Electronically Signed   By: Keith Rake M.D.   On: 12/21/2019 01:55   DG Chest Portable 1 View  Result Date: 12/21/2019 CLINICAL DATA:  Hypoxia EXAM: PORTABLE CHEST 1 VIEW COMPARISON:  12/04/2019 FINDINGS: Streaky basilar opacities without significant change. No interval consolidation or pleural effusion. Stable cardiomediastinal silhouette. No pneumothorax. IMPRESSION: No significant interval change in streaky bibasilar opacities. No new  infiltrate or edema. Electronically Signed   By: Donavan Foil M.D.   On: 12/21/2019 02:31      ____________________________________________   PROCEDURES  Procedure(s) performed: None Procedures Critical Care performed:  None ____________________________________________   INITIAL IMPRESSION / ASSESSMENT AND PLAN / ED COURSE  79 y.o. male with a history of dementia, chronic respiratory failure on 2 L nasal cannula, feeding tube, generalized muscle weakness, GERD, hypertension, hyperlipidemia, trigeminal neuralgia, diabetes, prostate cancer who presents for evaluation of tremors and increased O2 requirement.  Does not seem like patient had a seizure since he was able to communicate by shaking his head yes and no while having bilateral tremors.  Upon arrival to the emergency room patient is alert and oriented x3, able to fully answer questions about his medical history.  He denies any medical problems at this time.  He is satting well on his baseline 2 L nasal cannula.  His EKG shows no evidence of dysrhythmias or ischemia.  Differential diagnoses is broad and includes infection versus encephalopathy versus head bleed versus dehydration versus medication side effect versus dysrhythmia.  We will monitor patient closely.  We will do a head CT, check labs and urinalysis.  Will get a chest x-ray.    _________________________ 6:14 AM on 12/21/2019 -----------------------------------------  Initial labs showing AKI and hyperkalemia with no EKG changes.  Patient received IV fluids and Lasix and repeat BMP shows unchanged AKI and hyperkalemia.  Therefore will admit to the hospitalist service.  Will give D50, insulin and bicarb, Lokelma.    As part of my medical decision making, I reviewed the following data within the [facility] notes reviewed and incorporated, Labs reviewed , EKG interpreted , Old EKG reviewed, Old chart reviewed, Radiograph reviewed , Discussed with  admitting physician ,  Notes from prior ED visits and [place] Controlled Substance Database   Please note:  Patient was evaluated in Emergency Department today for the symptoms described in the history of present illness. Patient was evaluated in the context of the global COVID-19 pandemic, which necessitated consideration that the patient might be at risk for infection with the SARS-CoV-2 virus that causes COVID-19. Institutional protocols and algorithms that pertain to the evaluation of patients at risk for COVID-19 are in a state of rapid change based on information released by regulatory bodies including the CDC and federal and state organizations. These policies and algorithms were followed during the patient's care in the ED.  Some ED evaluations and interventions may be delayed as a result of limited staffing during the pandemic.   ____________________________________________   FINAL CLINICAL IMPRESSION(S) / ED DIAGNOSES   Final diagnoses:  Encephalopathy acute  Hyperkalemia  AKI (acute kidney injury) ([place])      NEW MEDICATIONS STARTED DURING THIS VISIT:  ED Discharge Orders    None       Note:  This document was prepared using Dragon voice recognition software and may include unintentional dictation errors.    Alfred Levins, [place], MD 12/21/19 [phone]

## 2019-12-21 NOTE — ED Notes (Signed)
Pt refusing to keep vital sign machine on

## 2019-12-22 ENCOUNTER — Inpatient Hospital Stay: Payer: No Typology Code available for payment source

## 2019-12-22 DIAGNOSIS — N3000 Acute cystitis without hematuria: Principal | ICD-10-CM

## 2019-12-22 DIAGNOSIS — L89152 Pressure ulcer of sacral region, stage 2: Secondary | ICD-10-CM

## 2019-12-22 DIAGNOSIS — F39 Unspecified mood [affective] disorder: Secondary | ICD-10-CM

## 2019-12-22 DIAGNOSIS — G9341 Metabolic encephalopathy: Secondary | ICD-10-CM

## 2019-12-22 DIAGNOSIS — E785 Hyperlipidemia, unspecified: Secondary | ICD-10-CM

## 2019-12-22 DIAGNOSIS — G500 Trigeminal neuralgia: Secondary | ICD-10-CM

## 2019-12-22 DIAGNOSIS — L8990 Pressure ulcer of unspecified site, unspecified stage: Secondary | ICD-10-CM | POA: Insufficient documentation

## 2019-12-22 DIAGNOSIS — N179 Acute kidney failure, unspecified: Secondary | ICD-10-CM

## 2019-12-22 DIAGNOSIS — E875 Hyperkalemia: Secondary | ICD-10-CM

## 2019-12-22 LAB — BASIC METABOLIC PANEL
Anion gap: 13 (ref 5–15)
BUN: 42 mg/dL — ABNORMAL HIGH (ref 8–23)
CO2: 26 mmol/L (ref 22–32)
Calcium: 9 mg/dL (ref 8.9–10.3)
Chloride: 100 mmol/L (ref 98–111)
Creatinine, Ser: 1.3 mg/dL — ABNORMAL HIGH (ref 0.61–1.24)
GFR calc Af Amer: >60 mL/min (ref >60)
GFR calc non Af Amer: 53 mL/min — ABNORMAL LOW (ref >60)
Glucose, Bld: 95 mg/dL (ref 70–99)
Potassium: 5.2 mmol/L — ABNORMAL HIGH (ref 3.5–5.1)
Sodium: 139 mmol/L (ref 135–145)

## 2019-12-22 LAB — CBC
HCT: 30.1 % — ABNORMAL LOW (ref 39.0–52.0)
Hemoglobin: 9.1 g/dL — ABNORMAL LOW (ref 13.0–17.0)
MCH: 27.5 pg (ref 26.0–34.0)
MCHC: 30.2 g/dL (ref 30.0–36.0)
MCV: 90.9 fL (ref 80.0–100.0)
Platelets: 579 10*3/uL — ABNORMAL HIGH (ref 150–400)
RBC: 3.31 MIL/uL — ABNORMAL LOW (ref 4.22–5.81)
RDW: 15.2 % (ref 11.5–15.5)
WBC: 9.1 10*3/uL (ref 4.0–10.5)
nRBC: 0 % (ref 0.0–0.2)

## 2019-12-22 LAB — GLUCOSE, CAPILLARY
Glucose-Capillary: 101 mg/dL — ABNORMAL HIGH (ref 70–99)
Glucose-Capillary: 145 mg/dL — ABNORMAL HIGH (ref 70–99)
Glucose-Capillary: 158 mg/dL — ABNORMAL HIGH (ref 70–99)

## 2019-12-22 MED ORDER — SODIUM ZIRCONIUM CYCLOSILICATE 10 G PO PACK
10.0000 g | PACK | Freq: Every day | ORAL | Status: DC
  Administered 2019-12-22 – 2019-12-24 (×3): 10 g via ORAL
  Filled 2019-12-22 (×3): qty 1

## 2019-12-22 MED ORDER — GADOBUTROL 1 MMOL/ML IV SOLN
10.0000 mL | Freq: Once | INTRAVENOUS | Status: AC | PRN
  Administered 2019-12-22: 14:00:00 10 mL via INTRAVENOUS

## 2019-12-22 MED ORDER — ORAL CARE MOUTH RINSE
15.0000 mL | Freq: Two times a day (BID) | OROMUCOSAL | Status: DC
  Administered 2019-12-22 – 2019-12-24 (×6): 15 mL via OROMUCOSAL

## 2019-12-22 NOTE — Progress Notes (Signed)
   12/22/19 0057  Vitals  Temp 98.6 F (37 C)  Temp Source Oral  BP 121/87  MAP (mmHg) 98  BP Location Right Arm  BP Method Automatic  Patient Position (if appropriate) Lying  Pulse Rate (!) 105  Pulse Rate Source Monitor  Resp 18  Oxygen Therapy  SpO2 100 %  O2 Device Nasal Cannula  O2 Flow Rate (L/min) 2 L/min  MEWS Score  MEWS RR 0  MEWS Pulse 1  MEWS Systolic 0  MEWS LOC 0  MEWS Temp 0  MEWS Score 1  MEWS Score Color Green  The patient is admitted to 1 A 137 with the diagnosis of acute metabolic encephalopathy. A & O x 3 with intermittent confusion and forgetfulness. The patient is oriented to his room, ascom/call bell and staff. The  patient has been re-oriented multiple times. Patient couldn't answer most of the admission questions and kept on saying, " I don't know." Will continue to monitor.

## 2019-12-22 NOTE — Progress Notes (Signed)
Patient stated he brought upper and lower dentures but we have only the upper one. Nikki RN called ED and they told her the patient only brought the upper denture.

## 2019-12-22 NOTE — Consult Note (Signed)
Reason for Consult:Seizure like episode Referring Physician: Leslye Peer  CC: Seizure like episode  HPI: Chad Allen is an 79 y.o. male who due to dementia is unable to provide any cogent history therefore all history is obtained from the chart.  Patient with medical history significant for hypertension, hyperlipidemia, diabetes mellitus, GERD, trigeminal neuralgia, PTSD, mood disorder, feeding G-tube placement, dementia, on 2L nasal cannula oxygen, who presented after an episode of seizure like activity. Per facility, nurse, " who said that she walked in the room and patient was foaming through the mouth and shaking. He was staring at the ceiling but would answer her questions by shaking his head yes or no. He was never unresponsive. She reported that she noticed oxygen had dropped to the 70s so she increasedhis oxygen to 4 L. He remained hypoxic which prompted the call to 911. When EMS arrived patient was satting well on his baseline 2 L oxygen. Had normal blood glucose. Patient was hypotensive with systolics in the [id number] and received 200 cc of fluid".  Currently patient at baseline.  He reports that at baseline he is nonambulatory.    Past Medical History:  Diagnosis Date  . Abnormal posture   . Dementia ([place])   . Dependence on continuous supplemental oxygen    2L O2 by [place]  . DNR (do not resuscitate)   . Feeding by G-tube ([place])   . Generalized muscle weakness   . GERD (gastroesophageal reflux disease)   . Hyperlipidemia   . Hypertension   . Malignant neoplasm of prostate ([place])   . Mood disorder ([place])   . Osteoarthritis   . PTSD (post-traumatic stress disorder)   . Trigeminal neuralgia   . Type 2 diabetes mellitus without complications ([place])   . Unsteadiness on feet     Past Surgical History:  Procedure Laterality Date  . GASTROSTOMY      Family history: Father with prostate cancer.  Both parents deceased.  Brother with DM and CHF.    Social History:  has no history on  file for tobacco, alcohol, and drug.  Allergies  Allergen Reactions  . Lisinopril Swelling  . Amitriptyline   . Celexa [Citalopram]   . Fluoxetine Other (See Comments)    Tremors  . Paroxetine   . Zoloft [Sertraline]     Medications:  I have reviewed the patient's current medications. Prior to Admission:  Medications Prior to Admission  Medication Sig Dispense Refill Last Dose  . acetaminophen (TYLENOL) 325 MG tablet Take 650 mg by mouth every 6 (six) hours as needed.   prn at prn  . ALPRAZolam (XANAX) 0.5 MG tablet Take 0.5 mg by mouth every 8 (eight) hours as needed for anxiety.   12/20/2019 at [place]  . amLODipine (NORVASC) 10 MG tablet Take 1 tablet (10 mg total) by mouth daily. (Patient taking differently: Take 10 mg by mouth daily. HOLD FOR SBP<110 AND HR<60) 30 tablet 0 12/20/2019 at 0905  . atorvastatin (LIPITOR) 20 MG tablet Take 20 mg by mouth at bedtime.   12/20/2019 at 2039  . carbamazepine (TEGRETOL) 100 MG chewable tablet Chew 100 mg by mouth 4 (four) times daily. Take with 200mg  tablet for a total dose of 300mg  daily.   12/20/2019 at 2039  . carbamazepine (TEGRETOL) 200 MG tablet Take 200 mg by mouth 4 (four) times daily. Take with 100mg  tablet for a total dose of 300mg  daily   12/20/2019 at 2039  . Cholecalciferol (D3-1000) 25 MCG (1000 UT) capsule Take 1,000 Units  by mouth daily.   12/20/2019 at 0850  . donepezil (ARICEPT) 10 MG tablet Take 10 mg by mouth at bedtime.   12/20/2019 at 2039  . DULoxetine (CYMBALTA) 30 MG capsule Take 30 mg by mouth daily.   12/20/2019 at 0850  . gabapentin (NEURONTIN) 100 MG capsule Take 2 capsules (200 mg total) by mouth 2 (two) times daily. 30 capsule 2 12/20/2019 at 2039  . insulin aspart (NOVOLOG) 100 UNIT/ML injection Inject 0-9 Units into the skin every 4 (four) hours. (Patient taking differently: Inject 0-12 Units into the skin every 4 (four) hours. Sliding scale) 10 mL 11 12/20/2019 at 1212  . Lacosamide (VIMPAT) 150 MG TABS Take 150  mg by mouth 2 (two) times daily.    12/20/2019 at 2039  . latanoprost (XALATAN) 0.005 % ophthalmic solution Place 1 drop into both eyes at bedtime.   12/20/2019 at 2039  . Lidocaine HCl (ASPERCREME W/LIDOCAINE) 4 % CREA Apply topically 2 (two) times daily. Apply to lower back twice daily for pain   12/20/2019 at 2130  . losartan (COZAAR) 50 MG tablet Take 1 tablet (50 mg total) by mouth daily. 30 tablet 0 12/20/2019 at 0850  . magnesium oxide (MAG-OX) 400 MG tablet Take 400 mg by mouth daily.   12/20/2019 at 0850  . metFORMIN (GLUCOPHAGE) 500 MG tablet Take 250 mg by mouth 2 (two) times daily with a meal.   12/20/2019 at 2039  . mupirocin ointment (BACTROBAN) 2 % Place 1 application into the nose daily. Apply to PEG tube insertion site following cleansing with wound cleanser daily until healed.   12/20/2019 at 1445  . Nutritional Supplements (FEEDING SUPPLEMENT, JEVITY 1.5 CAL/FIBER,) LIQD Place 1,000 mLs into feeding tube continuous.   12/20/2019 at Unknown time  . nystatin (MYCOSTATIN) 100000 UNIT/ML suspension Take 5 mLs (500,000 Units total) by mouth 4 (four) times daily. 60 mL 0 12/20/2019 at 2039  . pantoprazole (PROTONIX) 20 MG tablet Take 20 mg by mouth 2 (two) times daily.   12/20/2019 at 2039  . QUEtiapine (SEROQUEL) 25 MG tablet Take 25 mg by mouth at bedtime.   12/20/2019 at 2039  . traZODone (DESYREL) 150 MG tablet Take 150 mg by mouth at bedtime.   12/20/2019 at 2039  . Water For Irrigation, Sterile (FREE WATER) SOLN Place 150 mLs into feeding tube every 4 (four) hours.   12/20/2019 at Unknown time   Scheduled: . amLODipine  10 mg Oral Daily  . atorvastatin  20 mg Oral QHS  . carbamazepine  300 mg Oral QID  . cholecalciferol  1,000 Units Oral Daily  . donepezil  10 mg Oral QHS  . DULoxetine  30 mg Oral Daily  . enoxaparin (LOVENOX) injection  40 mg Subcutaneous Q24H  . free water  150 mL Per Tube Q4H  . gabapentin  200 mg Oral BID  . insulin aspart  0-5 Units Subcutaneous QHS   . insulin aspart  0-9 Units Subcutaneous TID WC  . lacosamide  150 mg Oral BID  . latanoprost  1 drop Both Eyes QHS  . lidocaine  1 patch Transdermal Q24H  . magnesium oxide  400 mg Oral Daily  . mouth rinse  15 mL Mouth Rinse BID  . mupirocin ointment  1 application Nasal Daily  . nystatin  5 mL Oral QID  . pantoprazole  20 mg Oral BID  . QUEtiapine  25 mg Oral QHS  . sodium zirconium cyclosilicate  10 g Oral Once  . sodium zirconium  cyclosilicate  10 g Oral Daily  . traZODone  150 mg Oral QHS    ROS: History obtained from the patient  General ROS: negative for - chills, fatigue, fever, night sweats, weight gain or weight loss Psychological ROS: memory difficulties Ophthalmic ROS: negative for - blurry vision, double vision, eye pain or loss of vision ENT ROS: negative for - epistaxis, nasal discharge, oral lesions, sore throat, tinnitus or vertigo Allergy and Immunology ROS: negative for - hives or itchy/watery eyes Hematological and Lymphatic ROS: negative for - bleeding problems, bruising or swollen lymph nodes Endocrine ROS: negative for - galactorrhea, hair pattern changes, polydipsia/polyuria or temperature intolerance Respiratory ROS: negative for - cough, hemoptysis, shortness of breath or wheezing Cardiovascular ROS: negative for - chest pain, dyspnea on exertion, edema or irregular heartbeat Gastrointestinal ROS: negative for - abdominal pain, diarrhea, hematemesis, nausea/vomiting or stool incontinence Genito-Urinary ROS: negative for - dysuria, hematuria, incontinence or urinary frequency/urgency Musculoskeletal ROS: back pain Neurological ROS: as noted in HPI Dermatological ROS: negative for rash and skin lesion changes  Physical Examination: Blood pressure (!) 101/56, pulse 89, temperature 99.1 F (37.3 C), temperature source Oral, resp. rate 18, height 5\' 7"  (1.702 m), weight 104 kg, SpO2 97 %.  HEENT-  Normocephalic, no lesions, without obvious abnormality.   Normal external eye and conjunctiva.  Normal TM's bilaterally.  Normal auditory canals and external ears. Normal external nose, mucus membranes and septum.  Normal pharynx. Cardiovascular- S1, S2 normal, pulses palpable throughout   Lungs- chest clear, no wheezing, rales, normal symmetric air entry Abdomen- soft, non-tender; bowel sounds normal; no masses,  no organomegaly Extremities- mild LE edema Lymph-no adenopathy palpable Musculoskeletal-no joint tenderness, deformity or swelling Skin-warm and dry, no hyperpigmentation, vitiligo, or suspicious lesions  Neurological Examination   Mental Status: Alert, oriented to name, month and year.  Amnestic of events leading to hospitalization.  Does not know where he is or where he resides.  Speech fluent without evidence of aphasia.  Easily distracted.  Able to follow 3 step commands with some reinforcement. Cranial Nerves: II: Visual fields grossly normal, pupils equal, round, reactive to light and accommodation III,IV, VI: ptosis not present, extra-ocular motions intact bilaterally V,VII: smile symmetric, facial light touch sensation normal bilaterally VIII: hearing normal bilaterally IX,X: gag reflex present XI: bilateral shoulder shrug XII: midline tongue extension Motor: 5/5 in the BUE's.  Does not move the lower extremities Sensory: Pinprick and light touch intact throughout, bilaterally Deep Tendon Reflexes: Symmetric throughout Plantars: Right: mute   Left: mute Cerebellar: Normal finger-to-nose testing bilaterally Gait: non ambulatory  Laboratory Studies:   Basic Metabolic Panel: Recent Labs  Lab 12/21/19 0118 12/21/19 0537 12/21/19 1045 12/22/19 0426  NA 133* 138  --  139  K 5.9* 5.8* 5.6* 5.2*  CL 96* 100  --  100  CO2 25 27  --  26  GLUCOSE 135* 123*  --  95  BUN 46* 48*  --  42*  CREATININE 1.46* 1.48*  --  1.30*  CALCIUM 8.6* 8.3*  --  9.0    Liver Function Tests: Recent Labs  Lab 12/21/19 0118  AST 13*   ALT 12  ALKPHOS 82  BILITOT 0.5  PROT 8.1  ALBUMIN 3.2*   No results for input(s): LIPASE, AMYLASE in the last 168 hours. No results for input(s): AMMONIA in the last 168 hours.  CBC: Recent Labs  Lab 12/21/19 0118 12/22/19 0426  WBC 10.9* 9.1  NEUTROABS 7.9*  --   HGB 10.6*  9.1*  HCT 32.7* 30.1*  MCV 85.4 90.9  PLT 580* 579*    Cardiac Enzymes: No results for input(s): CKTOTAL, CKMB, CKMBINDEX, TROPONINI in the last 168 hours.  BNP: Invalid input(s): POCBNP  CBG: Recent Labs  Lab 12/21/19 1416 12/21/19 1709 12/22/19 0019  GLUCAP 105* 110* 101*    Microbiology: Results for orders placed or performed during the hospital encounter of 12/21/19  Urine culture     Status: Abnormal (Preliminary result)   Collection Time: 12/21/19  3:24 AM   Specimen: Urine, Random  Result Value Ref Range Status   Specimen Description   Final    URINE, RANDOM Performed at [facility], [address], [place], [place] [address]    Special Requests   Final    NONE Performed at [facility], [address], [place], [place] [address]    Culture (A)  Final    >=100,000 COLONIES/mL GRAM NEGATIVE RODS IDENTIFICATION AND SUSCEPTIBILITIES TO FOLLOW Performed at [facility], [address], [place], [place] [address]    Report Status PENDING  Incomplete  CULTURE, BLOOD (ROUTINE X 2) w Reflex to ID Panel     Status: None (Preliminary result)   Collection Time: 12/21/19 10:45 AM   Specimen: BLOOD  Result Value Ref Range Status   Specimen Description BLOOD BLOOD RIGHT FOREARM  Final   Special Requests   Final    BOTTLES DRAWN AEROBIC AND ANAEROBIC Blood Culture adequate volume   Culture   Final    NO GROWTH < 24 HOURS Performed at [facility], [address], [place], [place] [address]    Report Status PENDING  Incomplete  CULTURE, BLOOD (ROUTINE X 2) w Reflex to ID Panel     Status: None (Preliminary result)   Collection Time: 12/21/19 10:45  AM   Specimen: BLOOD  Result Value Ref Range Status   Specimen Description BLOOD RIGHT ANTECUBITAL  Final   Special Requests   Final    BOTTLES DRAWN AEROBIC AND ANAEROBIC Blood Culture adequate volume   Culture   Final    NO GROWTH < 24 HOURS Performed at [facility], [address] [place], [place] [address]    Report Status PENDING  Incomplete  SARS CORONAVIRUS 2 (TAT 6-24 HRS) Nasopharyngeal Nasopharyngeal Swab     Status: None   Collection Time: 12/21/19 10:45 AM   Specimen: Nasopharyngeal Swab  Result Value Ref Range Status   SARS Coronavirus 2 NEGATIVE NEGATIVE Final    Comment: (NOTE) SARS-CoV-2 target nucleic acids are NOT DETECTED. The SARS-CoV-2 RNA is generally detectable in upper and lower respiratory specimens during the acute phase of infection. Negative results do not preclude SARS-CoV-2 infection, do not rule out co-infections with other pathogens, and should not be used as the sole basis for treatment or other patient management decisions. Negative results must be combined with clinical observations, patient history, and epidemiological information. The expected result is Negative. Fact Sheet for Patients: SugarRoll.be Fact Sheet for Healthcare Providers: https://www.woods-mathews.com/ This test is not yet approved or cleared by the [place] FDA and  has been authorized for detection and/or diagnosis of SARS-CoV-2 by FDA under an Emergency Use Authorization (EUA). This EUA will remain  in effect (meaning this test can be used) for the duration of the COVID-19 declaration under Section 56 4(b)(1) of the Act, 21 U.S.C. section 360bbb-3(b)(1), unless the authorization is terminated or revoked sooner. Performed at [facility], [place],  [place] [address]     Coagulation Studies: No results for input(s): LABPROT, INR in the last 72 hours.  Urinalysis:  Recent Labs  Lab  12/21/19 0324  COLORURINE YELLOW*  LABSPEC 1.012  PHURINE 7.0  GLUCOSEU NEGATIVE  HGBUR NEGATIVE  BILIRUBINUR NEGATIVE  KETONESUR NEGATIVE  PROTEINUR 30*  NITRITE NEGATIVE  LEUKOCYTESUR LARGE*    Lipid Panel:     Component Value Date/Time   TRIG 120 12/04/2019 2336    HgbA1C:  Lab Results  Component Value Date   HGBA1C 4.9 12/05/2019    Urine Drug Screen:  No results found for: LABOPIA, COCAINSCRNUR, LABBENZ, AMPHETMU, THCU, LABBARB  Alcohol Level: No results for input(s): ETH in the last 168 hours.  Other results: EKG: sinus tachycardia at 105 bpm.  Imaging: CT Head Wo Contrast  Result Date: 12/21/2019 CLINICAL DATA:  Seizure, nontraumatic (Age >= 21y) EXAM: CT HEAD WITHOUT CONTRAST TECHNIQUE: Contiguous axial images were obtained from the base of the skull through the vertex without intravenous contrast. COMPARISON:  None. FINDINGS: Brain: No intracranial hemorrhage, mass effect, or midline shift. Generalized atrophy. Ventricular enlargement likely due to central atrophy. Cavum septum pellucidum. The basilar cisterns are patent. Moderate periventricular white matter hypodensity most consistent with chronic small vessel ischemia. Question of periventricular lacunar infarcts. No evidence of territorial infarct or acute ischemia. No extra-axial or intracranial fluid collection. Vascular: Atherosclerosis of skullbase vasculature without hyperdense vessel or abnormal calcification. Skull: No fracture or focal lesion. Sinuses/Orbits: Paranasal sinuses and mastoid air cells are clear. The visualized orbits are unremarkable. Other: None. IMPRESSION: 1. Generalized atrophy and chronic small vessel ischemia. 2. Ventricular enlargement likely due to central atrophy, normal pressure hydrocephalus is difficult to exclude on imaging findings alone. Electronically Signed   By: Keith Rake M.D.   On: 12/21/2019 01:55   DG Chest Portable 1 View  Result Date: 12/21/2019 CLINICAL DATA:   Hypoxia EXAM: PORTABLE CHEST 1 VIEW COMPARISON:  12/04/2019 FINDINGS: Streaky basilar opacities without significant change. No interval consolidation or pleural effusion. Stable cardiomediastinal silhouette. No pneumothorax. IMPRESSION: No significant interval change in streaky bibasilar opacities. No new infiltrate or edema. Electronically Signed   By: Donavan Foil M.D.   On: 12/21/2019 02:31     Assessment/Plan:  79 y.o. male with medical history significant for hypertension, hyperlipidemia, diabetes mellitus, GERD, trigeminal neuralgia, PTSD, mood disorder, feeding G-tube placement, dementia, on 2L nasal cannula oxygen, who presented after an episode of seizure like activity. Description not particularly consistent with seizure.  Patient currently at baseline.  With UTI, AKI and metabolic abnormalities.  Suspect this is etiology for presenting event but further work up recommended.  Head CT reviewed and shows no acute changes.    Recommendations: 1. MRI of the brain with and without contrast    2. EEG 3. No additional anticonvulsant therapy indicated at this time.   4. Seizure precautions 5. Agree with continuing to address other medical issues  Alexis Goodell, MD Neurology [phone] 12/22/2019, 10:35 AM

## 2019-12-22 NOTE — ED Notes (Signed)
Attempted to call report at this time 

## 2019-12-22 NOTE — Progress Notes (Signed)
Patient ID: Chad Allen, male   DOB: [birth year], 79 y.o.   MRN: [id number] [facility] PROGRESS NOTE  [name] [id number] DOB: [birth year] DOA: 12/21/2019 PCP: Chad Morin, MD  HPI/Subjective: Patient up and alert today.  Feels okay and offers no complaints.  He did not realize that he is at [facility] home at this point.  He thought he is living in [place] with his daughter.  He could not tell me while he is here.  Patient sent in with altered mental status shaking and foaming at the mouth.  Found to have a urinary tract infection. Also found to hype hyperkalemia.  Objective: Vitals:   12/22/19 0746 12/22/19 1012  BP: (!) 101/56 (!) 101/56  Pulse: 89 89  Resp: 18 18  Temp: 99.1 F (37.3 C) 99.1 F (37.3 C)  SpO2: 97%     Intake/Output Summary (Last 24 hours) at 12/22/2019 1240 Last data filed at 12/22/2019 0900 Gross per 24 hour  Intake 658.37 ml  Output 350 ml  Net 308.37 ml   Filed Weights   12/22/19 1012  Weight: 104 kg    ROS: Review of Systems  Constitutional: Negative for chills and fever.  Eyes: Negative for blurred vision.  Respiratory: Negative for cough and shortness of breath.   Cardiovascular: Negative for chest pain.  Gastrointestinal: Negative for abdominal pain, constipation, diarrhea, nausea and vomiting.  Genitourinary: Negative for dysuria.  Musculoskeletal: Negative for joint pain.  Neurological: Negative for dizziness and headaches.   Exam: Physical Exam  HENT:  Nose: No mucosal edema.  Mouth/Throat: No oropharyngeal exudate or posterior oropharyngeal edema.  Eyes: Pupils are equal, round, and reactive to light. Conjunctivae, EOM and lids are normal.  Neck: No JVD present. Carotid bruit is not present. No thyroid mass and no thyromegaly present.  Cardiovascular: S1 normal and S2 normal. Exam reveals no gallop.  No murmur heard. Pulses:      Dorsalis pedis pulses are 2+ on the right side and 2+ on the  left side.  Respiratory: No respiratory distress. He has no wheezes. He has no rhonchi. He has no rales.  GI: Soft. Bowel sounds are normal. There is no abdominal tenderness.  Musculoskeletal:     Cervical back: No edema.     Right ankle: Swelling present.     Left ankle: Swelling present.  Lymphadenopathy:    He has no cervical adenopathy.  Neurological: He is alert. No cranial nerve deficit.  Unable to straight leg raise.  Skin: Skin is warm. Nails show no clubbing.  Stage II sacral decubiti as per nursing staff.  Psychiatric: He has a normal mood and affect.      Data Reviewed: Basic Metabolic Panel: Recent Labs  Lab 12/21/19 0118 12/21/19 0537 12/21/19 1045 12/22/19 0426  NA 133* 138  --  139  K 5.9* 5.8* 5.6* 5.2*  CL 96* 100  --  100  CO2 25 27  --  26  GLUCOSE 135* 123*  --  95  BUN 46* 48*  --  42*  CREATININE 1.46* 1.48*  --  1.30*  CALCIUM 8.6* 8.3*  --  9.0   Liver Function Tests: Recent Labs  Lab 12/21/19 0118  AST 13*  ALT 12  ALKPHOS 82  BILITOT 0.5  PROT 8.1  ALBUMIN 3.2*  CBC: Recent Labs  Lab 12/21/19 0118 12/22/19 0426  WBC 10.9* 9.1  NEUTROABS 7.9*  --   HGB 10.6* 9.1*  HCT 32.7* 30.1*  MCV 85.4 90.9  PLT 580* 579*   BNP (last 3 results) Recent Labs    12/04/19 2337  BNP 131.0*     CBG: Recent Labs  Lab 12/21/19 1416 12/21/19 1709 12/22/19 0019 12/22/19 1137  GLUCAP 105* 110* 101* 145*    Recent Results (from the past 240 hour(s))  Urine culture     Status: Abnormal (Preliminary result)   Collection Time: 12/21/19  3:24 AM   Specimen: Urine, Random  Result Value Ref Range Status   Specimen Description   Final    URINE, RANDOM Performed at [facility], [address], [place], [place] [address]    Special Requests   Final    NONE Performed at [facility], [address], [place], [place] [address]    Culture (A)  Final    >=100,000 COLONIES/mL GRAM NEGATIVE RODS IDENTIFICATION AND  SUSCEPTIBILITIES TO FOLLOW Performed at [facility], [place] [address] [place], [place] [address]    Report Status PENDING  Incomplete  CULTURE, BLOOD (ROUTINE X 2) w Reflex to ID Panel     Status: None (Preliminary result)   Collection Time: 12/21/19 10:45 AM   Specimen: BLOOD  Result Value Ref Range Status   Specimen Description BLOOD BLOOD RIGHT FOREARM  Final   Special Requests   Final    BOTTLES DRAWN AEROBIC AND ANAEROBIC Blood Culture adequate volume   Culture   Final    NO GROWTH < 24 HOURS Performed at [facility], [address], [place], [place] [address]    Report Status PENDING  Incomplete  CULTURE, BLOOD (ROUTINE X 2) w Reflex to ID Panel     Status: None (Preliminary result)   Collection Time: 12/21/19 10:45 AM   Specimen: BLOOD  Result Value Ref Range Status   Specimen Description BLOOD RIGHT ANTECUBITAL  Final   Special Requests   Final    BOTTLES DRAWN AEROBIC AND ANAEROBIC Blood Culture adequate volume   Culture   Final    NO GROWTH < 24 HOURS Performed at [facility], [address] [place], [place] [address]    Report Status PENDING  Incomplete  SARS CORONAVIRUS 2 (TAT 6-24 HRS) Nasopharyngeal Nasopharyngeal Swab     Status: None   Collection Time: 12/21/19 10:45 AM   Specimen: Nasopharyngeal Swab  Result Value Ref Range Status   SARS Coronavirus 2 NEGATIVE NEGATIVE Final    Comment: (NOTE) SARS-CoV-2 target nucleic acids are NOT DETECTED. The SARS-CoV-2 RNA is generally detectable in upper and lower respiratory specimens during the acute phase of infection. Negative results do not preclude SARS-CoV-2 infection, do not rule out co-infections with other pathogens, and should not be used as the sole basis for treatment or other patient management decisions. Negative results must be combined with clinical observations, patient history, and epidemiological information. The expected result is Negative. Fact Sheet for  Patients: SugarRoll.be Fact Sheet for Healthcare Providers: https://www.woods-mathews.com/ This test is not yet approved or cleared by the [place] FDA and  has been authorized for detection and/or diagnosis of SARS-CoV-2 by FDA under an Emergency Use Authorization (EUA). This EUA will remain  in effect (meaning this test can be used) for the duration of the COVID-19 declaration under Section 56 4(b)(1) of the Act, 21 U.S.C. section 360bbb-3(b)(1), unless the authorization is terminated or revoked sooner. Performed at [facility], [place] [address] [place], [place] [address]      Studies: CT Head Wo Contrast  Result Date: 12/21/2019  CLINICAL DATA:  Seizure, nontraumatic (Age >= 41y) EXAM: CT HEAD WITHOUT CONTRAST TECHNIQUE: Contiguous axial images were obtained from the base of the skull through the vertex without intravenous contrast. COMPARISON:  None. FINDINGS: Brain: No intracranial hemorrhage, mass effect, or midline shift. Generalized atrophy. Ventricular enlargement likely due to central atrophy. Cavum septum pellucidum. The basilar cisterns are patent. Moderate periventricular white matter hypodensity most consistent with chronic small vessel ischemia. Question of periventricular lacunar infarcts. No evidence of territorial infarct or acute ischemia. No extra-axial or intracranial fluid collection. Vascular: Atherosclerosis of skullbase vasculature without hyperdense vessel or abnormal calcification. Skull: No fracture or focal lesion. Sinuses/Orbits: Paranasal sinuses and mastoid air cells are clear. The visualized orbits are unremarkable. Other: None. IMPRESSION: 1. Generalized atrophy and chronic small vessel ischemia. 2. Ventricular enlargement likely due to central atrophy, normal pressure hydrocephalus is difficult to exclude on imaging findings alone. Electronically Signed   By: Keith Rake M.D.   On: 12/21/2019 01:55   DG  Chest Port 1 View  Result Date: 12/22/2019 CLINICAL DATA:  Cough, altered mental status, hypertension EXAM: PORTABLE CHEST 1 VIEW COMPARISON:  12/21/2019 FINDINGS: Normal heart size and vascularity. Similar perihilar and basilar bronchitic changes and or scarring. No focal pneumonia, collapse or consolidation. Negative for edema, effusion or pneumothorax. Trachea midline. Degenerative changes and scoliosis of the spine. IMPRESSION: Stable bronchitic changes and basilar atelectasis/scarring. No new finding Electronically Signed   By: Jerilynn Mages.  Shick M.D.   On: 12/22/2019 11:47   DG Chest Portable 1 View  Result Date: 12/21/2019 CLINICAL DATA:  Hypoxia EXAM: PORTABLE CHEST 1 VIEW COMPARISON:  12/04/2019 FINDINGS: Streaky basilar opacities without significant change. No interval consolidation or pleural effusion. Stable cardiomediastinal silhouette. No pneumothorax. IMPRESSION: No significant interval change in streaky bibasilar opacities. No new infiltrate or edema. Electronically Signed   By: Donavan Foil M.D.   On: 12/21/2019 02:31    Scheduled Meds: . amLODipine  10 mg Oral Daily  . atorvastatin  20 mg Oral QHS  . carbamazepine  300 mg Oral QID  . cholecalciferol  1,000 Units Oral Daily  . donepezil  10 mg Oral QHS  . DULoxetine  30 mg Oral Daily  . enoxaparin (LOVENOX) injection  40 mg Subcutaneous Q24H  . free water  150 mL Per Tube Q4H  . gabapentin  200 mg Oral BID  . insulin aspart  0-5 Units Subcutaneous QHS  . insulin aspart  0-9 Units Subcutaneous TID WC  . lacosamide  150 mg Oral BID  . latanoprost  1 drop Both Eyes QHS  . lidocaine  1 patch Transdermal Q24H  . magnesium oxide  400 mg Oral Daily  . mouth rinse  15 mL Mouth Rinse BID  . mupirocin ointment  1 application Nasal Daily  . nystatin  5 mL Oral QID  . pantoprazole  20 mg Oral BID  . QUEtiapine  25 mg Oral QHS  . sodium zirconium cyclosilicate  10 g Oral Once  . sodium zirconium cyclosilicate  10 g Oral Daily  . traZODone   150 mg Oral QHS   Continuous Infusions: . sodium chloride 75 mL/hr at 12/22/19 0135  . cefTRIAXone (ROCEPHIN)  IV 1 g (12/22/19 1029)  . feeding supplement (JEVITY 1.5 CAL/FIBER)      Assessment/Plan:  1. Acute metabolic encephalopathy with shaking episode and foaming at the mouth.  Unable to obtain EEG on the weekend.  Appreciate neurology consultation.  MRI of the brain ordered.  Mental status improved today.  2. Acute cystitis without hematuria.  Follow-up urine culture.  Empiric Rocephin.  Patient does have a low-grade temperature. 3. Acute kidney injury with hyperkalemia.  Potassium still elevated today at 5.2.  Continue Lokelma on a daily basis.  IV fluids for acute kidney injury.  Creatinine slightly improved down to 1.3.  Recheck BMP and potassium in the morning. 4. Trigeminal neuralgia on gabapentin, Tegretol and Vimpat 5. Dementia without behavioral disturbance on Aricept 6. Mood disorder on Seroquel, Cymbalta and Xanax 7. Essential hypertension.  Will hold medication since blood pressure is on the lower side 8. GERD on PPI 9. Stage II decubitus ulcer present on admission 10. Hyperlipidemia unspecified on Lipitor  Code Status:     Code Status Orders  (From admission, onward)         Start     Ordered   12/21/19 1018  Do not attempt resuscitation (DNR)  Continuous    Question Answer Comment  In the event of cardiac or respiratory ARREST Do not call a "code blue"   In the event of cardiac or respiratory ARREST Do not perform Intubation, CPR, defibrillation or ACLS   In the event of cardiac or respiratory ARREST Use medication by any route, position, wound care, and other measures to relive pain and suffering. May use oxygen, suction and manual treatment of airway obstruction as needed for comfort.      12/21/19 1017        Code Status History    Date Active Date Inactive Code Status Order ID Comments User Context   12/05/2019 0230 12/10/2019 0053 DNR [id number]   Athena Masse, MD ED   12/04/2019 2350 12/05/2019 0230 DNR [id number]  Hinda Kehr, MD ED   Advance Care Planning Activity    Advance Directive Documentation     Most Recent Value  Type of Advance Directive  Out of facility DNR (pink MOST or yellow form)  Pre-existing out of facility DNR order (yellow form or pink MOST form)  Yellow form placed in chart (order not valid for inpatient use)  "MOST" Form in Place?  --     Family Communication: Spoke with daughter on the phone Disposition Plan: Once kidney function and potassium are better can potentially go back to facility  Consultants:  Neurology  Antibiotics:  Rocephin  Time spent: 28 minutes  [place]

## 2019-12-23 DIAGNOSIS — G9340 Encephalopathy, unspecified: Secondary | ICD-10-CM

## 2019-12-23 DIAGNOSIS — I10 Essential (primary) hypertension: Secondary | ICD-10-CM

## 2019-12-23 DIAGNOSIS — F0390 Unspecified dementia without behavioral disturbance: Secondary | ICD-10-CM

## 2019-12-23 LAB — BASIC METABOLIC PANEL
Anion gap: 11 (ref 5–15)
BUN: 34 mg/dL — ABNORMAL HIGH (ref 8–23)
CO2: 24 mmol/L (ref 22–32)
Calcium: 8.5 mg/dL — ABNORMAL LOW (ref 8.9–10.3)
Chloride: 102 mmol/L (ref 98–111)
Creatinine, Ser: 1.27 mg/dL — ABNORMAL HIGH (ref 0.61–1.24)
GFR calc Af Amer: >60 mL/min (ref >60)
GFR calc non Af Amer: 55 mL/min — ABNORMAL LOW (ref >60)
Glucose, Bld: 228 mg/dL — ABNORMAL HIGH (ref 70–99)
Potassium: 4.8 mmol/L (ref 3.5–5.1)
Sodium: 137 mmol/L (ref 135–145)

## 2019-12-23 LAB — URINE CULTURE: Culture: >=100000 — AB

## 2019-12-23 LAB — GLUCOSE, CAPILLARY
Glucose-Capillary: 110 mg/dL — ABNORMAL HIGH (ref 70–99)
Glucose-Capillary: 206 mg/dL — ABNORMAL HIGH (ref 70–99)
Glucose-Capillary: 122 mg/dL — ABNORMAL HIGH (ref 70–99)
Glucose-Capillary: 126 mg/dL — ABNORMAL HIGH (ref 70–99)

## 2019-12-23 MED ORDER — CEPHALEXIN 500 MG PO CAPS
500.0000 mg | ORAL_CAPSULE | Freq: Three times a day (TID) | ORAL | Status: DC
  Administered 2019-12-23 – 2019-12-24 (×4): 500 mg via ORAL
  Filled 2019-12-23 (×4): qty 1

## 2019-12-23 MED ORDER — ENSURE ENLIVE PO LIQD
237.0000 mL | Freq: Three times a day (TID) | ORAL | Status: DC
  Administered 2019-12-23 – 2019-12-24 (×4): 237 mL via ORAL

## 2019-12-23 MED ORDER — OCUVITE-LUTEIN PO CAPS
1.0000 | ORAL_CAPSULE | Freq: Every day | ORAL | Status: DC
  Administered 2019-12-24: 09:00:00 1 via ORAL
  Filled 2019-12-23: qty 1

## 2019-12-23 MED ORDER — FREE WATER: 60.0000 mL | Freq: Every day | Status: DC

## 2019-12-23 NOTE — Progress Notes (Signed)
Initial Nutrition Assessment  DOCUMENTATION CODES:   Obesity unspecified  INTERVENTION:  Plan is to discontinue tube feed orders for now as patient is eating well. Ordered calorie count to help determine if tube feeds are needed at this time.  Even if patient will not require tube feeds at this time consider keeping G-J tube in place and maintaining patency by flushing with 60 mL once daily. Per review of chart patient has very poor PO intake when he is in a flare of trigeminal neuralgia and may benefit from occasional use of tube for feeds during flares.  Provide Ensure Enlive po TID, each supplement provides 350 kcal and 20 grams of protein. Patient prefers strawberry.  Provide Ocuvite daily for wound healing (provides zinc, vitamin A, vitamin C, Vitamin E, copper, and selenium).  NUTRITION DIAGNOSIS:   Biting/chewing difficulty related to chronic illness(pain from trigeminal neuralgia) as evidenced by (need for dysphagia 1 diet with thin liquids).  GOAL:   Patient will meet greater than or equal to 90% of their needs  MONITOR:   PO intake, Supplement acceptance, Labs, Weight trends, Skin, I & O's  REASON FOR ASSESSMENT:   Consult(verbal consult) Assessment of nutrition requirement/status  ASSESSMENT:   79 year old male with PMHx of dementia, hx prostate cancer, DM, HTN, HLD, OA, GERD, PTSD, trigeminal neuralgia, hx G-J tube placement on 03/20/2019 in setting of poor PO intake when having flares of trigeminal neuralgia who is now admitted with acute metabolic encephalopathy, UTI, AKI.   Verbal consult received from RN and MD. Patient has enteral access and tube feed orders but is eating well at meals. Plan is to determine if tube feeds are needed anymore. Tube feeds were ordered but never started due to missing connection.  Met with patient at bedside. He was a little confused but does report he is eating well. According to chart he ate 100% of dinner last night (856 kcal, 37  grams of protein) and 70% of breakfast this morning (561 kcal, 18 grams of protein). Patient would not answer question about whether he was still receiving tube feeds or not at facility and kept asking if he was sitting up. He does report he is amenable to drinking strawberry oral nutrition supplement.  Spoke with RN at [facility] over the phone. There has been difficulty with patient's tube functioning properly lately. It has been getting clogged and not flushing with water. He has still been ordered for Jevity 1.5 Cal at 60 mL/hr at facility. He does eat a dysphagia 1 diet with thin liquids as well there.  Reviewed weight history in chart. Patient was 98.1 kg on 02/24/2019, 98 kg on 03/14/2019, 92 kg on 07/11/2019, 89.6 kg on 08/26/2019, 83.7 kg on 12/07/2019, 104.3 kg on 12/11/2019, and is now listed as 104 kg (229.28 lbs). Unsure if weight from 12/18 was accurate.  Medications reviewed and include: vitamin D3 1000 units daily, gabapentin, Novolog 0-9 units TID, Novolog 0-5 units QHS, magnesium oxide 400 mg daily, pantoprazole, Seroquel.  Labs reviewed: CBG 101-158, BUN 34, Creatinine 1.27.  Enteral Access: G-J tube but according to abdominal x-ray from 12/18 the tube is looped in stomach with tip over pyloric region so he no longer has jejunal access  Patient does not meet criteria for malnutrition at this time. Some level of muscle wasting expected in setting of limited mobility.  NUTRITION - FOCUSED PHYSICAL EXAM:    Most Recent Value  Orbital Region  No depletion  Upper Arm Region  Mild depletion  Thoracic and Lumbar Region  No depletion  Buccal Region  No depletion  Temple Region  No depletion  Clavicle Bone Region  Mild depletion  Clavicle and Acromion Bone Region  Mild depletion  Scapular Bone Region  Unable to assess  Dorsal Hand  No depletion  Patellar Region  Mild depletion  Anterior Thigh Region  Mild depletion  Posterior Calf Region  Moderate depletion  Edema (RD  Assessment)  Mild  Hair  Reviewed  Eyes  Reviewed  Mouth  Reviewed  Skin  Reviewed  Nails  Reviewed     Diet Order:   Diet Order            DIET - DYS 1 Room service appropriate? Yes; Fluid consistency: Thin  Diet effective now             EDUCATION NEEDS:   No education needs have been identified at this time  Skin:  Skin Assessment: Skin Integrity Issues: Skin Integrity Issues:: Stage II Stage II: sacrum  Last BM:  12/22/2018 - small type 6  Height:   Ht Readings from Last 1 Encounters:  12/22/19 '5\' 7"'$  (1.702 m)   Weight:   Wt Readings from Last 1 Encounters:  12/22/19 104 kg   Ideal Body Weight:  67.3 kg  BMI:  Body mass index is 35.91 kg/m.  Estimated Nutritional Needs:   Kcal:  1900-2100  Protein:  95-105 grams  Fluid:  1.9-2.1 L/day  Jacklynn Barnacle, MS, RD, LDN Office: [phone] Pager: [phone] After Hours/Weekend Pager: [phone]

## 2019-12-23 NOTE — Progress Notes (Signed)
Subjective: No new neurological complaints  Objective: Current vital signs: BP 133/67 (BP Location: Right Arm)   Pulse 93   Temp 97.8 F (36.6 C) (Oral)   Resp 16   Ht 5\' 7"  (1.702 m)   Wt 104 kg   SpO2 99%   BMI 35.91 kg/m  Vital signs in last 24 hours: Temp:  [97.7 F (36.5 C)-99.1 F (37.3 C)] 97.8 F (36.6 C) (01/03 0817) Pulse Rate:  [89-93] 93 (01/03 0817) Resp:  [16-18] 16 (01/03 0817) BP: (101-133)/(56-67) 133/67 (01/03 0817) SpO2:  [96 %-99 %] 99 % (01/03 0817) Weight:  [104 kg] 104 kg (01/02 1012)  Intake/Output from previous day: 01/02 0701 - 01/03 0700 In: 2331.3 [P.O.:600; I.V.:1531.3; IV Piggyback:200] Out: 2000 [Urine:2000] Intake/Output this shift: No intake/output data recorded. Nutritional status:  Diet Order            DIET - DYS 1 Room service appropriate? Yes; Fluid consistency: Thin  Diet effective now              Neurologic Exam: Mental Status: Alert, oriented to name, month and year.  Speech fluent without evidence of aphasia.  Easily distracted.  Able to follow 3 step commands with some reinforcement. Cranial Nerves: II: Visual fields grossly normal, pupils equal, round, reactive to light and accommodation III,IV, VI: ptosis not present, extra-ocular motions intact bilaterally V,VII: smile symmetric, facial light touch sensation normal bilaterally VIII: hearing normal bilaterally IX,X: gag reflex present XI: bilateral shoulder shrug XII: midline tongue extension Motor: 5/5 in the BUE's.  Does not move the lower extremities   Lab Results: Basic Metabolic Panel: Recent Labs  Lab 12/21/19 0118 12/21/19 0537 12/21/19 1045 12/22/19 0426  NA 133* 138  --  139  K 5.9* 5.8* 5.6* 5.2*  CL 96* 100  --  100  CO2 25 27  --  26  GLUCOSE 135* 123*  --  95  BUN 46* 48*  --  42*  CREATININE 1.46* 1.48*  --  1.30*  CALCIUM 8.6* 8.3*  --  9.0    Liver Function Tests: Recent Labs  Lab 12/21/19 0118  AST 13*  ALT 12  ALKPHOS 82   BILITOT 0.5  PROT 8.1  ALBUMIN 3.2*   No results for input(s): LIPASE, AMYLASE in the last 168 hours. No results for input(s): AMMONIA in the last 168 hours.  CBC: Recent Labs  Lab 12/21/19 0118 12/22/19 0426  WBC 10.9* 9.1  NEUTROABS 7.9*  --   HGB 10.6* 9.1*  HCT 32.7* 30.1*  MCV 85.4 90.9  PLT 580* 579*    Cardiac Enzymes: No results for input(s): CKTOTAL, CKMB, CKMBINDEX, TROPONINI in the last 168 hours.  Lipid Panel: No results for input(s): CHOL, TRIG, HDL, CHOLHDL, VLDL, LDLCALC in the last 168 hours.  CBG: Recent Labs  Lab 12/21/19 1709 12/22/19 0019 12/22/19 1137 12/22/19 1648 12/23/19 0816  GLUCAP 110* 101* 145* 158* 110*    Microbiology: Results for orders placed or performed during the hospital encounter of 12/21/19  Urine culture     Status: Abnormal (Preliminary result)   Collection Time: 12/21/19  3:24 AM   Specimen: Urine, Random  Result Value Ref Range Status   Specimen Description   Final    URINE, RANDOM Performed at [facility], [address], [place], [place] [address]    Special Requests   Final    NONE Performed at [facility], [address], [place], [place] [address]    Culture (  A)  Final    >=100,000 COLONIES/mL KLEBSIELLA PNEUMONIAE SUSCEPTIBILITIES TO FOLLOW Performed at [facility], [place] [address], [place], [place] [address]    Report Status PENDING  Incomplete  CULTURE, BLOOD (ROUTINE X 2) w Reflex to ID Panel     Status: None (Preliminary result)   Collection Time: 12/21/19 10:45 AM   Specimen: BLOOD  Result Value Ref Range Status   Specimen Description BLOOD BLOOD RIGHT FOREARM  Final   Special Requests   Final    BOTTLES DRAWN AEROBIC AND ANAEROBIC Blood Culture adequate volume   Culture   Final    NO GROWTH 2 DAYS Performed at [facility], [address], [place], [place] [address]    Report Status PENDING  Incomplete  CULTURE, BLOOD (ROUTINE X 2) w Reflex to ID Panel      Status: None (Preliminary result)   Collection Time: 12/21/19 10:45 AM   Specimen: BLOOD  Result Value Ref Range Status   Specimen Description BLOOD RIGHT ANTECUBITAL  Final   Special Requests   Final    BOTTLES DRAWN AEROBIC AND ANAEROBIC Blood Culture adequate volume   Culture   Final    NO GROWTH 2 DAYS Performed at [facility], [address] [place], [place] [address]    Report Status PENDING  Incomplete  SARS CORONAVIRUS 2 (TAT 6-24 HRS) Nasopharyngeal Nasopharyngeal Swab     Status: None   Collection Time: 12/21/19 10:45 AM   Specimen: Nasopharyngeal Swab  Result Value Ref Range Status   SARS Coronavirus 2 NEGATIVE NEGATIVE Final    Comment: (NOTE) SARS-CoV-2 target nucleic acids are NOT DETECTED. The SARS-CoV-2 RNA is generally detectable in upper and lower respiratory specimens during the acute phase of infection. Negative results do not preclude SARS-CoV-2 infection, do not rule out co-infections with other pathogens, and should not be used as the sole basis for treatment or other patient management decisions. Negative results must be combined with clinical observations, patient history, and epidemiological information. The expected result is Negative. Fact Sheet for Patients: SugarRoll.be Fact Sheet for Healthcare Providers: https://www.woods-mathews.com/ This test is not yet approved or cleared by the [place] FDA and  has been authorized for detection and/or diagnosis of SARS-CoV-2 by FDA under an Emergency Use Authorization (EUA). This EUA will remain  in effect (meaning this test can be used) for the duration of the COVID-19 declaration under Section 56 4(b)(1) of the Act, 21 U.S.C. section 360bbb-3(b)(1), unless the authorization is terminated or revoked sooner. Performed at [facility], [place] [address] [place], [place] [address]     Coagulation Studies: No results for input(s): LABPROT, INR in  the last 72 hours.  Imaging: MR BRAIN W WO CONTRAST  Result Date: 12/22/2019 CLINICAL DATA:  Dementia.  Seizure. EXAM: MRI HEAD WITHOUT AND WITH CONTRAST TECHNIQUE: Multiplanar, multiecho pulse sequences of the brain and surrounding structures were obtained without and with intravenous contrast. CONTRAST:  57mL GADAVIST GADOBUTROL 1 MMOL/ML IV SOLN COMPARISON:  Head CT yesterday. FINDINGS: Brain: Diffusion imaging does not show any acute or subacute infarction. No focal finding affects the brainstem or cerebellum. Cerebral hemispheres show generalized atrophy with advanced chronic small-vessel ischemic changes of the white matter. There is an old focus of hemosiderin deposition in the left parietal white matter probably related to an old stroke. No large vessel territory infarction. No mass lesion, recent hemorrhage or extra-axial collection. The ventricles are prominent, probably due to ex vacuo enlargement given the  other findings. Normal pressure hydrocephalus not completely excluded. Vascular: Major vessels at the base of the brain show flow. Skull and upper cervical spine: Negative Sinuses/Orbits: Clear/normal Other: None IMPRESSION: No acute finding by MRI. Brain atrophy. Extensive chronic small-vessel ischemic changes of the cerebral hemispheric white matter. Ventriculomegaly probably due to central atrophy. Cannot completely rule out normal pressure hydrocephalus, but the findings are not strongly suspicious. Electronically Signed   By: Nelson Chimes M.D.   On: 12/22/2019 14:19   DG Chest Port 1 View  Result Date: 12/22/2019 CLINICAL DATA:  Cough, altered mental status, hypertension EXAM: PORTABLE CHEST 1 VIEW COMPARISON:  12/21/2019 FINDINGS: Normal heart size and vascularity. Similar perihilar and basilar bronchitic changes and or scarring. No focal pneumonia, collapse or consolidation. Negative for edema, effusion or pneumothorax. Trachea midline. Degenerative changes and scoliosis of the spine.  IMPRESSION: Stable bronchitic changes and basilar atelectasis/scarring. No new finding Electronically Signed   By: Jerilynn Mages.  Shick M.D.   On: 12/22/2019 11:47    Medications:  I have reviewed the patient's current medications. Scheduled: . atorvastatin  20 mg Oral QHS  . carbamazepine  300 mg Oral QID  . cholecalciferol  1,000 Units Oral Daily  . donepezil  10 mg Oral QHS  . DULoxetine  30 mg Oral Daily  . enoxaparin (LOVENOX) injection  40 mg Subcutaneous Q24H  . free water  150 mL Per Tube Q4H  . gabapentin  200 mg Oral BID  . insulin aspart  0-5 Units Subcutaneous QHS  . insulin aspart  0-9 Units Subcutaneous TID WC  . lacosamide  150 mg Oral BID  . latanoprost  1 drop Both Eyes QHS  . lidocaine  1 patch Transdermal Q24H  . magnesium oxide  400 mg Oral Daily  . mouth rinse  15 mL Mouth Rinse BID  . mupirocin ointment  1 application Nasal Daily  . nystatin  5 mL Oral QID  . pantoprazole  20 mg Oral BID  . QUEtiapine  25 mg Oral QHS  . sodium zirconium cyclosilicate  10 g Oral Once  . sodium zirconium cyclosilicate  10 g Oral Daily  . traZODone  150 mg Oral QHS    Assessment/Plan:  79 y.o. male with medical history significant forhypertension, hyperlipidemia, diabetes mellitus, GERD, trigeminal neuralgia, PTSD, mood disorder, feeding G-tube placement, dementia,on 2Lnasal cannula oxygen,who presented after an episode of seizure like activity. Description not particularly consistent with seizure.  Patient currently at baseline.  With UTI, AKI and metabolic abnormalities.  Suspect this is etiology for presenting event.  MRI of the brain reviewed and shows no acute changes.  EEG pending.    Recommendations: 1. Would continue Vimpat at current dose 2. EEG pending.  May be performed as an outpatient if necessary 3. Seizure precautions    LOS: 2 days   Alexis Goodell, MD Neurology [phone] 12/23/2019  9:08 AM

## 2019-12-23 NOTE — Progress Notes (Signed)
[facility] NAME: Chad Allen    MR#:  [id number]  DATE OF BIRTH:  04-13-1941  SUBJECTIVE:   Doing well. Tolerating his pured diet. Tells me he feels like he's "in the prison" because he cannot move or walk due to severe arthritis  No seizures reported. REVIEW OF SYSTEMS:   Review of Systems  Constitutional: Negative for chills, fever and weight loss.  HENT: Negative for ear discharge, ear pain and nosebleeds.   Eyes: Negative for blurred vision, pain and discharge.  Respiratory: Negative for sputum production, shortness of breath, wheezing and stridor.   Cardiovascular: Negative for chest pain, palpitations, orthopnea and PND.  Gastrointestinal: Negative for abdominal pain, diarrhea, nausea and vomiting.  Genitourinary: Negative for frequency and urgency.  Musculoskeletal: Positive for joint pain. Negative for back pain.  Neurological: Negative for sensory change, speech change and focal weakness.  Psychiatric/Behavioral: Negative for depression and hallucinations. The patient is not nervous/anxious.    Tolerating Diet:pureed Tolerating PT: Bedbound  DRUG ALLERGIES:   Allergies  Allergen Reactions  . Lisinopril Swelling  . Amitriptyline   . Celexa [Citalopram]   . Fluoxetine Other (See Comments)    Tremors  . Paroxetine   . Zoloft [Sertraline]     VITALS:  Blood pressure 133/67, pulse 93, temperature 97.8 F (36.6 C), temperature source Oral, resp. rate 16, height 5\' 7"  (1.702 m), weight 104 kg, SpO2 99 %.  PHYSICAL EXAMINATION:   Physical Exam  GENERAL:  79 y.o.-year-old patient lying in the bed with no acute distress. Chronically ill EYES: Pupils equal, round, reactive to light and accommodation. No scleral icterus. Extraocular muscles intact.  HEENT: Head atraumatic, normocephalic. Oropharynx and nasopharynx clear.  NECK:  Supple, no jugular venous distention. No thyroid enlargement, no tenderness.   LUNGS: Normal breath sounds bilaterally, no wheezing, rales, rhonchi. No use of accessory muscles of respiration.  CARDIOVASCULAR: S1, S2 normal. No murmurs, rubs, or gallops.  ABDOMEN: Soft, nontender, nondistended. Bowel sounds present. No organomegaly or mass. Peg tube EXTREMITIES:bilateral lower extremity contractures witho atrophy chronic dry skin and thick nails NEUROLOGIC:no cranial nerve deficit. Alert  Bilateral chronic LE weakness PSYCHIATRIC:  patient is alert and awake  SKIN: Stage II sacral decubiti present on admission--per staff  LABORATORY PANEL:  CBC Recent Labs  Lab 12/22/19 0426  WBC 9.1  HGB 9.1*  HCT 30.1*  PLT 579*    Chemistries  Recent Labs  Lab 12/21/19 0118 12/22/19 0426  NA 133* 139  K 5.9* 5.2*  CL 96* 100  CO2 25 26  GLUCOSE 135* 95  BUN 46* 42*  CREATININE 1.46* 1.30*  CALCIUM 8.6* 9.0  AST 13*  --   ALT 12  --   ALKPHOS 82  --   BILITOT 0.5  --    Cardiac Enzymes No results for input(s): TROPONINI in the last 168 hours. RADIOLOGY:  MR BRAIN W WO CONTRAST  Result Date: 12/22/2019 CLINICAL DATA:  Dementia.  Seizure. EXAM: MRI HEAD WITHOUT AND WITH CONTRAST TECHNIQUE: Multiplanar, multiecho pulse sequences of the brain and surrounding structures were obtained without and with intravenous contrast. CONTRAST:  56mL GADAVIST GADOBUTROL 1 MMOL/ML IV SOLN COMPARISON:  Head CT yesterday. FINDINGS: Brain: Diffusion imaging does not show any acute or subacute infarction. No focal finding affects the brainstem or cerebellum. Cerebral hemispheres show generalized atrophy with advanced chronic small-vessel ischemic changes of the white matter. There is an old focus of hemosiderin deposition in the left  parietal white matter probably related to an old stroke. No large vessel territory infarction. No mass lesion, recent hemorrhage or extra-axial collection. The ventricles are prominent, probably due to ex vacuo enlargement given the other findings. Normal  pressure hydrocephalus not completely excluded. Vascular: Major vessels at the base of the brain show flow. Skull and upper cervical spine: Negative Sinuses/Orbits: Clear/normal Other: None IMPRESSION: No acute finding by MRI. Brain atrophy. Extensive chronic small-vessel ischemic changes of the cerebral hemispheric white matter. Ventriculomegaly probably due to central atrophy. Cannot completely rule out normal pressure hydrocephalus, but the findings are not strongly suspicious. Electronically Signed   By: Nelson Chimes M.D.   On: 12/22/2019 14:19   DG Chest Port 1 View  Result Date: 12/22/2019 CLINICAL DATA:  Cough, altered mental status, hypertension EXAM: PORTABLE CHEST 1 VIEW COMPARISON:  12/21/2019 FINDINGS: Normal heart size and vascularity. Similar perihilar and basilar bronchitic changes and or scarring. No focal pneumonia, collapse or consolidation. Negative for edema, effusion or pneumothorax. Trachea midline. Degenerative changes and scoliosis of the spine. IMPRESSION: Stable bronchitic changes and basilar atelectasis/scarring. No new finding Electronically Signed   By: Jerilynn Mages.  Shick M.D.   On: 12/22/2019 11:47   ASSESSMENT AND PLAN:   Chad Allen is a 79 y.o. male with medical history significant of hypertension, hyperlipidemia, diabetes mellitus, GERD, trigeminal neuralgia, PTSD, mood disorder, feeding G-tube placement, dementia, on 2L nasal cannula oxygen, who presents with altered mental status  Acute metabolic encephalopathy:  - Possibly due to UTI. -seen by Dr Doy Mince - EEG pending--can be done as out pt--no seizures inhouse -Seizure precaution -When necessary Ativan for seizure -Continue Vimpat  UTI (urinary tract infection): -on Rocephin  -Urine culture Klebsiella --sensitive to kelfex  AKI (acute kidney injury) ([place]):  -hold cozarr -creat improving D/c IVF -cont oral fluids and free water flushes thru PEG  S/p Chronic PEG placement  - pt seems not using the  G-tube -will give dysphagia 1 diet -Not sure why he has order for [facility]  Trigeminal neuralgia: -on gabapentin and Tegretol  Dementia without behavioral disturbance ([place]): - Aricept  Mood disorder ([place]) -continue Seroquel, Cymbalta, and xanax  HTN:  -Continue home medications: Amlodipine -hold Cozaar due to AKI -hydralazine prn  GERD (gastroesophageal reflux disease): -PPI  Hyperkalemia:  K 5.9--5.2--BMP today -recieved Lokelma  Overall improving. He is best at baseline. Will discharged back tomorrow [place] if he continues to show improvement. Social worker informed of anticipate discharge tomorrow  Procedures:none Family communication :pt Consults : neurology Discharge Disposition : [facility] long-term  CODE STATUS: DNR prior to admission DVT Prophylaxis : Lovenox  TOTAL TIME TAKING CARE OF THIS PATIENT: *25* minutes.  >50% time spent on counselling and coordination of care  POSSIBLE D/C IN *1* DAYS, DEPENDING ON CLINICAL CONDITION.  Note: This dictation was prepared with Dragon dictation along with smaller phrase technology. Any transcriptional errors that result from this process are unintentional.  Fritzi Mandes M.D on 12/23/2019 at 9:48 AM  Between 7am to 6pm - Pager - [phone]  After 6pm go to [email or web address]  [facility]   CC: Primary care physician; Alvester Morin, MDPatient ID: Jessy Oto, male   DOB: 09-29-1941, 79 y.o.   MRN: [id number]

## 2019-12-24 DIAGNOSIS — G9340 Encephalopathy, unspecified: Secondary | ICD-10-CM

## 2019-12-24 LAB — GLUCOSE, CAPILLARY
Glucose-Capillary: 109 mg/dL — ABNORMAL HIGH (ref 70–99)
Glucose-Capillary: 105 mg/dL — ABNORMAL HIGH (ref 70–99)
Glucose-Capillary: 110 mg/dL — ABNORMAL HIGH (ref 70–99)
Glucose-Capillary: 120 mg/dL — ABNORMAL HIGH (ref 70–99)
Glucose-Capillary: 141 mg/dL — ABNORMAL HIGH (ref 70–99)

## 2019-12-24 MED ORDER — CEPHALEXIN 500 MG PO CAPS: 500.0000 mg | ORAL_CAPSULE | Freq: Three times a day (TID) | ORAL | 0 refills | Status: AC

## 2019-12-24 MED ORDER — OCUVITE-LUTEIN PO CAPS: 1.0000 | ORAL_CAPSULE | Freq: Every day | ORAL | 0 refills | Status: AC

## 2019-12-24 NOTE — TOC Initial Note (Signed)
Transition of Care [facility]) - Initial/Assessment Note    Patient Details  Name: Chad Allen MRN: [id number] Date of Birth: [birth year]  [facility] CM/SW Contact:    Shelbie Ammons, RN Phone Number: 12/24/2019, 7:06 PM  Clinical Narrative:       RNCM completed assessment via bedside and telephone. Attempted bedside assessment but due to cognitive status unable to fully assess patient except that he reports he wants to return to the facility he came from. Placed call to patient's daughter and left message for return call. Placed call to [facility] and placed call to Hilda Blades who reports patient is a resident and will return there. Patient to discharge this afternoon. EMS arrangements made and MD updated.    Expected Discharge Plan: Long Term Nursing Home Barriers to Discharge: Barriers Resolved   Patient Goals and CMS Choice Patient states their goals for this hospitalization and ongoing recovery are:: Per patient, I just want to go back home      Expected Discharge Plan and Services Expected Discharge Plan: [place]   Discharge Planning Services: CM Consult   Living arrangements for the past 2 months: [place] Expected Discharge Date: 12/24/19                                    Prior Living Arrangements/Services Living arrangements for the past 2 months: [place] Lives with:: Facility Resident Patient language and need for interpreter reviewed:: Yes Do you feel safe going back to the place where you live?: Yes      Need for Family Participation in Patient Care: No (Comment) Care giver support system in place?: No (comment)   Criminal Activity/Legal Involvement Pertinent to Current Situation/Hospitalization: No - Comment as needed  Activities of Daily Living Home Assistive Devices/Equipment: Other (Comment)(bed bound) ADL Screening (condition at time of admission) Patient's cognitive ability adequate  to safely complete daily activities?: No Is the patient deaf or have difficulty hearing?: No Does the patient have difficulty seeing, even when wearing glasses/contacts?: No Does the patient have difficulty concentrating, remembering, or making decisions?: Yes Patient able to express need for assistance with ADLs?: Yes Does the patient have difficulty dressing or bathing?: Yes Independently performs ADLs?: No Does the patient have difficulty walking or climbing stairs?: Yes Weakness of Legs: Both Weakness of Arms/Hands: Both  Permission Sought/Granted                  Emotional Assessment Appearance:: Appears stated age Attitude/Demeanor/Rapport: Other (comment)(confused)   Orientation: : Oriented to Self   Psych Involvement: No (comment)  Admission diagnosis:  Hyperkalemia [E87.5] Encephalopathy acute [G93.40] AKI (acute kidney injury) ([place]) 123456 Acute metabolic encephalopathy [id number] Patient Active Problem List   Diagnosis Date Noted  . Encephalopathy acute   . Pressure injury of skin 12/22/2019  . Trigeminal neuralgia   . Decubitus ulcer of sacral region, stage 2 ([place])   . Hyperlipidemia   . Mood disorder ([place])   . DNR (do not resuscitate)   . GERD (gastroesophageal reflux disease)   . AKI (acute kidney injury) ([place])   . Hyperkalemia   . LBBB (left bundle branch block)   . Hypokalemia 12/06/2019  . Hypomagnesemia 12/06/2019  . Mouth pain 12/06/2019  . Acute metabolic encephalopathy [id number]  . Sepsis ([place]) 12/05/2019  . UTI (urinary tract infection) 12/05/2019  . 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 Dementia  without behavioral disturbance ([place]) 12/05/2019  . Acute on chronic respiratory failure with hypoxia ([place]) 12/05/2019  . Essential hypertension 12/05/2019   PCP:  Alvester Morin, MD Pharmacy:  No Pharmacies Listed    Social Determinants of Health (SDOH) Interventions    Readmission Risk Interventions Readmission Risk Prevention  Plan 12/24/2019  Transportation Screening Complete  Medication Review (RN Care Manager) Complete  PCP or Specialist appointment within 3-5 days of discharge Complete  [place] Complete

## 2019-12-24 NOTE — Progress Notes (Signed)
Called report to [place] at [facility]. Patient did not have any IVs to remove. Patient is being transported via EMS.

## 2019-12-24 NOTE — Discharge Instructions (Signed)
General nursing care per protocol

## 2019-12-24 NOTE — Progress Notes (Addendum)
Calorie Count Note  48 hour calorie count ordered. Day 1 results:  Diet: Dysphagia 1 with thin liquids Supplements: Ensure Enlive po TID, each supplement provides 350 kcal and 20 grams of protein  Lunch 1/3: 942 kcal, 42 grams of protein Dinner 1/3: 355 kcal, 20 grams of protein Breakfast 1/4: 725 kcal, 38 grams of protein Supplements: 1050 kcal, 60 grams of protein   Total intake: 3072 kcal (>100% of minimum estimated needs)  160 protein (>100% of minimum estimated needs)  Estimated Nutritional Needs:  Kcal:  1900-2100 Protein:  95-105 grams Fluid:  1.9-2.1 L/day  Nutrition Dx: Biting/chewing difficulty related to chronic illness(pain from trigeminal neuralgia) as evidenced by (need for dysphagia 1 diet with thin liquids).  Goal: Patient will meet greater than or equal to 90% of their needs  Intervention: -Will discontinue calorie count as plan is for patient to discharge today. -Will discontinue Ensure Enlive as patient met 100% estimated calorie/protein needs from meals alone. -Recommend keeping G-J tube in place and maintaining patency by flushing with 60 mL once daily. Per review of chart patient has very poor PO intake when he is in a flare of trigeminal neuralgia and may benefit from occasional use of tube for feeds during flares.  Jacklynn Barnacle, MS, RD, LDN Office: [phone] Pager: [phone] After Hours/Weekend Pager: [phone]

## 2019-12-24 NOTE — Discharge Summary (Signed)
[facility] [place] NAME: Chad Allen    MR#:  [id number]  DATE OF BIRTH:  05-18-1941  DATE OF ADMISSION:  12/21/2019 ADMITTING PHYSICIAN: Ivor Costa, MD  DATE OF DISCHARGE: 12/24/2019  PRIMARY CARE PHYSICIAN: Alvester Morin, MD    ADMISSION DIAGNOSIS:  Hyperkalemia [E87.5] Encephalopathy acute [G93.40] AKI (acute kidney injury) ([place]) 123456 Acute metabolic encephalopathy [id number]  DISCHARGE DIAGNOSIS:  acute metabolic encephalopathy resolved acute failure improving hyperkalemia resolved Klebsiella UTI SECONDARY DIAGNOSIS:   Past Medical History:  Diagnosis Date  . Abnormal posture   . Dementia ([place])   . 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 DNR (do not resuscitate)   . Feeding by G-tube ([place])   . Generalized muscle weakness   . GERD (gastroesophageal reflux disease)   . Hyperlipidemia   . Hypertension   . Malignant neoplasm of prostate ([place])   . Mood disorder ([place])   . Osteoarthritis   . PTSD (post-traumatic stress disorder)   . Trigeminal neuralgia   . Type 2 diabetes mellitus without complications ([place])   . Unsteadiness on feet     HOSPITAL COURSE:   Hien Abdul-Mumitis a 79 y.o.malewith medical history significant ofhypertension, hyperlipidemia, diabetes mellitus, GERD, trigeminal neuralgia, PTSD, mood disorder, feeding G-tube placement, dementia,on 2Lnasal cannula oxygen,who presents with altered mental status  Acute metabolic encephalopathy:--mentation back to baseline -Possibly due to UTI. -seen by Dr Doy Mince - EEG pending--can be done as out pt--no seizures inhouse -Continue Vimpat  UTI (urinary tract infection): -on Rocephin  -Urine culture Klebsiella --sensitive to kelfex treat for 5 days  AKI (acute kidney injury) [facility]): with hyperkalemia -hold cozaar--BP stbale -creat improving D/c IVF -cont oral fluids and free water flushes thru PEG  S/p  Chronic PEG placement  -pt seems not using the G-tube -will givedysphagia1diet -dietitian assessment no need for additional G-tube feeding  Trigeminal neuralgia: -ongabapentinandTegretol  Dementia without behavioral disturbance ([employer]): -Aricept  Mood disorder ([place]) -continueSeroquel, Cymbalta,and xanax  HTN:  -Continue home medications:Amlodipine -holdCozaardue to AKI and hyperkalemia. Blood pressure stable -hydralazine prn  GERD (gastroesophageal reflux disease): -PPI  Hyperkalemia:K 5.9--5.2--4.8 -recieved Lokelma  Diabetes Mellitus-type 2, controlled -continue sliding scale insulin -metformin be resumed once sugars start remaining more than 150. -Holding metformin at discharge--- for to [facility] MD to assess and initiate metformin if needed -continue sliding scale  Overall improving. He is best at baseline. [facility]   Procedures:none Family communication :pt Consults : neurology Discharge Disposition : [facility] long[facility]term  CODE STATUS: DNR prior to admission DVT Prophylaxis : Lovenox CONSULTS OBTAINED:  Treatment Team:  Alexis Goodell, MD  DRUG ALLERGIES:   Allergies  Allergen Reactions  . Lisinopril Swelling  . Amitriptyline   . Celexa [Citalopram]   . Fluoxetine Other (See Comments)    Tremors  . Paroxetine   . Zoloft [Sertraline]     DISCHARGE MEDICATIONS:   Allergies as of 12/24/2019      Reactions   Lisinopril Swelling   Amitriptyline    Celexa [citalopram]    Fluoxetine Other (See Comments)   Tremors   Paroxetine    Zoloft [sertraline]       Medication List    STOP taking these medications   feeding supplement (JEVITY 1.5 CAL/FIBER) Liqd   losartan 50 MG tablet Commonly known as: COZAAR   metFORMIN 500 MG tablet Commonly known as: GLUCOPHAGE   nystatin 100000  UNIT/ML suspension Commonly known as: MYCOSTATIN     TAKE these medications    acetaminophen 325 MG tablet Commonly known as: TYLENOL Take 650 mg by mouth every 6 (six) hours as needed.   ALPRAZolam 0.5 MG tablet Commonly known as: XANAX Take 0.5 mg by mouth every 8 (eight) hours as needed for anxiety.   amLODipine 10 MG tablet Commonly known as: NORVASC Take 1 tablet (10 mg total) by mouth daily. What changed: additional instructions   Aspercreme w/Lidocaine 4 % Crea Generic drug: Lidocaine HCl Apply topically 2 (two) times daily. Apply to lower back twice daily for pain   atorvastatin 20 MG tablet Commonly known as: LIPITOR Take 20 mg by mouth at bedtime.   carbamazepine 200 MG tablet Commonly known as: TEGRETOL Take 200 mg by mouth 4 (four) times daily. Take with 100mg  tablet for a total dose of 300mg  daily   carbamazepine 100 MG chewable tablet Commonly known as: TEGRETOL Chew 100 mg by mouth 4 (four) times daily. Take with 200mg  tablet for a total dose of 300mg  daily.   cephALEXin 500 MG capsule Commonly known as: KEFLEX Take 1 capsule (500 mg total) by mouth every 8 (eight) hours for 3 days.   D3-1000 25 MCG (1000 UT) capsule Generic drug: Cholecalciferol Take 1,000 Units by mouth daily.   donepezil 10 MG tablet Commonly known as: ARICEPT Take 10 mg by mouth at bedtime.   DULoxetine 30 MG capsule Commonly known as: CYMBALTA Take 30 mg by mouth daily.   free water Soln Place 150 mLs into feeding tube every 4 (four) hours.   gabapentin 100 MG capsule Commonly known as: Neurontin Take 2 capsules (200 mg total) by mouth 2 (two) times daily.   insulin aspart 100 UNIT/ML injection Commonly known as: novoLOG Inject 0-9 Units into the skin every 4 (four) hours. What changed:   how much to take  when to take this  additional instructions   latanoprost 0.005 % ophthalmic solution Commonly known as: XALATAN Place 1 drop into both eyes at bedtime.   magnesium oxide 400 MG tablet Commonly known as: MAG-OX Take 400 mg by mouth  daily.   multivitamin-lutein Caps capsule Take 1 capsule by mouth daily. Start taking on: December 25, 2019   mupirocin ointment 2 % Commonly known as: BACTROBAN Place 1 application into the nose daily. Apply to PEG tube insertion site following cleansing with wound cleanser daily until healed.   pantoprazole 20 MG tablet Commonly known as: PROTONIX Take 20 mg by mouth 2 (two) times daily.   QUEtiapine 25 MG tablet Commonly known as: SEROQUEL Take 25 mg by mouth at bedtime.   traZODone 150 MG tablet Commonly known as: DESYREL Take 150 mg by mouth at bedtime.   Vimpat 150 MG Tabs Generic drug: Lacosamide Take 150 mg by mouth 2 (two) times daily.       If you experience worsening of your admission symptoms, develop shortness of breath, life threatening emergency, suicidal or homicidal thoughts you must seek medical attention immediately by calling 911 or calling your MD immediately  if symptoms less severe.  You Must read complete instructions/literature along with all the possible adverse reactions/side effects for all the Medicines you take and that have been prescribed to you. Take any new Medicines after you have completely understood and accept all the possible adverse reactions/side effects.   Please note  You were cared for by a hospitalist during your hospital stay. If you have any questions about your  discharge medications or the care you received while you were in the hospital after you are discharged, you can call the unit and asked to speak with the hospitalist on call if the hospitalist that took care of you is not available. Once you are discharged, your primary care physician will handle any further medical issues. Please note that NO REFILLS for any discharge medications will be authorized once you are discharged, as it is imperative that you return to your primary care physician (or establish a relationship with a primary care physician if you do not have one) for your  aftercare needs so that they can reassess your need for medications and monitor your lab values. Today   SUBJECTIVE   Eating good breakfast. Denies any complaints  VITAL SIGNS:  Blood pressure 111/60, pulse 88, temperature 98.3 F (36.8 C), temperature source Oral, resp. rate 16, height 5\' 7"  (1.702 m), weight 104 kg, SpO2 94 %.  I/O:    Intake/Output Summary (Last 24 hours) at 12/24/2019 0939 Last data filed at 12/23/2019 2137 Gross per 24 hour  Intake 240 ml  Output 1250 ml  Net -1010 ml    PHYSICAL EXAMINATION:   GENERAL:  79 y.o.-year-old patient lying in the bed with no acute distress. Chronically ill EYES: Pupils equal, round, reactive to light and accommodation. No scleral icterus. Extraocular muscles intact.  HEENT: Head atraumatic, normocephalic. Oropharynx and nasopharynx clear.  NECK:  Supple, no jugular venous distention. No thyroid enlargement, no tenderness.  LUNGS: Normal breath sounds bilaterally, no wheezing, rales, rhonchi. No use of accessory muscles of respiration.  CARDIOVASCULAR: S1, S2 normal. No murmurs, rubs, or gallops.  ABDOMEN: Soft, nontender, nondistended. Bowel sounds present. No organomegaly or mass. Peg tube EXTREMITIES:bilateral lower extremity contractures with atrophy chronic dry skin and thick nails NEUROLOGIC:no cranial nerve deficit. Alert  Bilateral chronic LE weakness PSYCHIATRIC:  patient is alert and awake  SKIN: Stage II sacral decubiti present on admission--per staff DATA REVIEW:   CBC  Recent Labs  Lab 12/22/19 0426  WBC 9.1  HGB 9.1*  HCT 30.1*  PLT 579*    Chemistries  Recent Labs  Lab 12/21/19 0118 12/23/19 0948  NA 133* 137  K 5.9* 4.8  CL 96* 102  CO2 25 24  GLUCOSE 135* 228*  BUN 46* 34*  CREATININE 1.46* 1.27*  CALCIUM 8.6* 8.5*  AST 13*  --   ALT 12  --   ALKPHOS 82  --   BILITOT 0.5  --     Microbiology Results   Recent Results (from the past 240 hour(s))  Urine culture     Status: Abnormal    Collection Time: 12/21/19  3:24 AM   Specimen: Urine, Random  Result Value Ref Range Status   Specimen Description   Final    URINE, RANDOM Performed at [facility], [address], [place], [place] [address]    Special Requests   Final    NONE Performed at [facility], [address] [place], [place] [address]    Culture >=100,000 COLONIES/mL KLEBSIELLA PNEUMONIAE (A)  Final   Report Status 12/23/2019 FINAL  Final   Organism ID, Bacteria KLEBSIELLA PNEUMONIAE (A)  Final      Susceptibility   Klebsiella pneumoniae - MIC*    AMPICILLIN >=32 RESISTANT Resistant     CEFAZOLIN <=4 SENSITIVE Sensitive     CEFTRIAXONE <=0.25 SENSITIVE Sensitive     CIPROFLOXACIN <=0.25 SENSITIVE Sensitive     GENTAMICIN <=1 SENSITIVE Sensitive  IMIPENEM 0.5 SENSITIVE Sensitive     NITROFURANTOIN 128 RESISTANT Resistant     TRIMETH/SULFA <=20 SENSITIVE Sensitive     AMPICILLIN/SULBACTAM 8 SENSITIVE Sensitive     PIP/TAZO <=4 SENSITIVE Sensitive     * >=100,000 COLONIES/mL KLEBSIELLA PNEUMONIAE  CULTURE, BLOOD (ROUTINE X 2) w Reflex to ID Panel     Status: None (Preliminary result)   Collection Time: 12/21/19 10:45 AM   Specimen: BLOOD  Result Value Ref Range Status   Specimen Description BLOOD BLOOD RIGHT FOREARM  Final   Special Requests   Final    BOTTLES DRAWN AEROBIC AND ANAEROBIC Blood Culture adequate volume   Culture   Final    NO GROWTH 3 DAYS Performed at [facility], [address], [place], [place] [address]    Report Status PENDING  Incomplete  CULTURE, BLOOD (ROUTINE X 2) w Reflex to ID Panel     Status: None (Preliminary result)   Collection Time: 12/21/19 10:45 AM   Specimen: BLOOD  Result Value Ref Range Status   Specimen Description BLOOD RIGHT ANTECUBITAL  Final   Special Requests   Final    BOTTLES DRAWN AEROBIC AND ANAEROBIC Blood Culture adequate volume   Culture   Final    NO GROWTH 3 DAYS Performed at [facility], [address] [place], [place] [address]    Report Status PENDING  Incomplete  SARS CORONAVIRUS 2 (TAT 6-24 HRS) Nasopharyngeal Nasopharyngeal Swab     Status: None   Collection Time: 12/21/19 10:45 AM   Specimen: Nasopharyngeal Swab  Result Value Ref Range Status   SARS Coronavirus 2 NEGATIVE NEGATIVE Final    Comment: (NOTE) SARS-CoV-2 target nucleic acids are NOT DETECTED. The SARS-CoV-2 RNA is generally detectable in upper and lower respiratory specimens during the acute phase of infection. Negative results do not preclude SARS-CoV-2 infection, do not rule out co-infections with other pathogens, and should not be used as the sole basis for treatment or other patient management decisions. Negative results must be combined with clinical observations, patient history, and epidemiological information. The expected result is Negative. Fact Sheet for Patients: SugarRoll.be Fact Sheet for Healthcare Providers: https://www.woods-mathews.com/ This test is not yet approved or cleared by the [place] FDA and  has been authorized for detection and/or diagnosis of SARS-CoV-2 by FDA under an Emergency Use Authorization (EUA). This EUA will remain  in effect (meaning this test can be used) for the duration of the COVID-19 declaration under Section 56 4(b)(1) of the Act, 21 U.S.C. section 360bbb-3(b)(1), unless the authorization is terminated or revoked sooner. Performed at [facility], [place] [address], [place], [place] [address]     RADIOLOGY:  MR BRAIN W WO CONTRAST  Result Date: 12/22/2019 CLINICAL DATA:  Dementia.  Seizure. EXAM: MRI HEAD WITHOUT AND WITH CONTRAST TECHNIQUE: Multiplanar, multiecho pulse sequences of the brain and surrounding structures were obtained without and with intravenous contrast. CONTRAST:  62mL GADAVIST GADOBUTROL 1 MMOL/ML IV SOLN COMPARISON:  Head CT yesterday. FINDINGS: Brain: Diffusion imaging does not show any  acute or subacute infarction. No focal finding affects the brainstem or cerebellum. Cerebral hemispheres show generalized atrophy with advanced chronic small-vessel ischemic changes of the white matter. There is an old focus of hemosiderin deposition in the left parietal white matter probably related to an old stroke. No large vessel territory infarction. No mass lesion, recent hemorrhage or extra-axial collection. The ventricles are prominent, probably due to ex vacuo enlargement given the other findings.  Normal pressure hydrocephalus not completely excluded. Vascular: Major vessels at the base of the brain show flow. Skull and upper cervical spine: Negative Sinuses/Orbits: Clear/normal Other: None IMPRESSION: No acute finding by MRI. Brain atrophy. Extensive chronic small-vessel ischemic changes of the cerebral hemispheric white matter. Ventriculomegaly probably due to central atrophy. Cannot completely rule out normal pressure hydrocephalus, but the findings are not strongly suspicious. Electronically Signed   By: Nelson Chimes M.D.   On: 12/22/2019 14:19   DG Chest Port 1 View  Result Date: 12/22/2019 CLINICAL DATA:  Cough, altered mental status, hypertension EXAM: PORTABLE CHEST 1 VIEW COMPARISON:  12/21/2019 FINDINGS: Normal heart size and vascularity. Similar perihilar and basilar bronchitic changes and or scarring. No focal pneumonia, collapse or consolidation. Negative for edema, effusion or pneumothorax. Trachea midline. Degenerative changes and scoliosis of the spine. IMPRESSION: Stable bronchitic changes and basilar atelectasis/scarring. No new finding Electronically Signed   By: Jerilynn Mages.  Shick M.D.   On: 12/22/2019 11:47     CODE STATUS:     Code Status Orders  (From admission, onward)         Start     Ordered   12/21/19 1018  Do not attempt resuscitation (DNR)  Continuous    Question Answer Comment  In the event of cardiac or respiratory ARREST Do not call a "code blue"   In the event of  cardiac or respiratory ARREST Do not perform Intubation, CPR, defibrillation or ACLS   In the event of cardiac or respiratory ARREST Use medication by any route, position, wound care, and other measures to relive pain and suffering. May use oxygen, suction and manual treatment of airway obstruction as needed for comfort.      12/21/19 1017        Code Status History    Date Active Date Inactive Code Status Order ID Comments User Context   12/05/2019 0230 12/10/2019 0053 DNR [id number]  Athena Masse, MD ED   12/04/2019 2350 12/05/2019 0230 DNR [id number]  Hinda Kehr, MD ED   Advance Care Planning Activity    Advance Directive Documentation     Most Recent Value  Type of Advance Directive  Out of facility DNR (pink MOST or yellow form)  Pre-existing out of facility DNR order (yellow form or pink MOST form)  Yellow form placed in chart (order not valid for inpatient use)  "MOST" Form in Place?  --       TOTAL TIME TAKING CARE OF THIS PATIENT: 40 minutes.    Fritzi Mandes M.D on 12/24/2019 at 9:39 AM  Between 7am to 6pm - Pager - [phone] After 6pm go to [email or web address] - password [email or web address]  [facility]    CC: Primary care physician; Alvester Morin, MD

## 2019-12-24 NOTE — Progress Notes (Signed)
Patient ID: Chad Allen, male   DOB: 07/24/1941, 79 y.o.   MRN: [id number]  Left message for patient's daughter regarding patient's update and MRI results.

## 2019-12-26 LAB — CULTURE, BLOOD (ROUTINE X 2)
Culture: NO GROWTH
Culture: NO GROWTH
Special Requests: ADEQUATE
Special Requests: ADEQUATE

## 2020-01-10 ENCOUNTER — Emergency Department: Payer: No Typology Code available for payment source

## 2020-01-10 ENCOUNTER — Emergency Department
Admission: EM | Admit: 2020-01-10 | Discharge: 2020-01-11 | Disposition: A | Discharge status: Home / Self Care | Payer: No Typology Code available for payment source | Attending: Emergency Medicine | Admitting: Emergency Medicine

## 2020-01-10 ENCOUNTER — Other Ambulatory Visit: Payer: Self-pay

## 2020-01-10 DIAGNOSIS — I10 Essential (primary) hypertension: Secondary | ICD-10-CM | POA: Insufficient documentation

## 2020-01-10 DIAGNOSIS — G500 Trigeminal neuralgia: Secondary | ICD-10-CM | POA: Insufficient documentation

## 2020-01-10 DIAGNOSIS — F0390 Unspecified dementia without behavioral disturbance: Secondary | ICD-10-CM | POA: Diagnosis not present

## 2020-01-10 DIAGNOSIS — G8929 Other chronic pain: Secondary | ICD-10-CM | POA: Diagnosis not present

## 2020-01-10 DIAGNOSIS — Z79899 Other long term (current) drug therapy: Secondary | ICD-10-CM | POA: Diagnosis not present

## 2020-01-10 DIAGNOSIS — M545 Low back pain, unspecified: Secondary | ICD-10-CM

## 2020-01-10 DIAGNOSIS — Z794 Long term (current) use of insulin: Secondary | ICD-10-CM | POA: Diagnosis not present

## 2020-01-10 DIAGNOSIS — E119 Type 2 diabetes mellitus without complications: Secondary | ICD-10-CM | POA: Insufficient documentation

## 2020-01-10 LAB — CBC WITH DIFFERENTIAL/PLATELET
Abs Immature Granulocytes: 0.02 10*3/uL (ref 0.00–0.07)
Basophils Absolute: 0.1 10*3/uL (ref 0.0–0.1)
Basophils Relative: 1 %
Eosinophils Absolute: 0.2 10*3/uL (ref 0.0–0.5)
Eosinophils Relative: 2 %
HCT: 39.6 % (ref 39.0–52.0)
Hemoglobin: 12.2 g/dL — ABNORMAL LOW (ref 13.0–17.0)
Immature Granulocytes: 0 %
Lymphocytes Relative: 17 %
Lymphs Abs: 1.5 10*3/uL (ref 0.7–4.0)
MCH: 27.8 pg (ref 26.0–34.0)
MCHC: 30.8 g/dL (ref 30.0–36.0)
MCV: 90.2 fL (ref 80.0–100.0)
Monocytes Absolute: 0.7 10*3/uL (ref 0.1–1.0)
Monocytes Relative: 8 %
Neutro Abs: 6.1 10*3/uL (ref 1.7–7.7)
Neutrophils Relative %: 72 %
Platelets: 454 10*3/uL — ABNORMAL HIGH (ref 150–400)
RBC: 4.39 MIL/uL (ref 4.22–5.81)
RDW: 15.2 % (ref 11.5–15.5)
WBC: 8.5 10*3/uL (ref 4.0–10.5)
nRBC: 0 % (ref 0.0–0.2)

## 2020-01-10 LAB — BASIC METABOLIC PANEL
Anion gap: 14 (ref 5–15)
BUN: 30 mg/dL — ABNORMAL HIGH (ref 8–23)
CO2: 24 mmol/L (ref 22–32)
Calcium: 10.1 mg/dL (ref 8.9–10.3)
Chloride: 103 mmol/L (ref 98–111)
Creatinine, Ser: 1.51 mg/dL — ABNORMAL HIGH (ref 0.61–1.24)
GFR calc Af Amer: 51 mL/min — ABNORMAL LOW (ref >60)
GFR calc non Af Amer: 44 mL/min — ABNORMAL LOW (ref >60)
Glucose, Bld: 133 mg/dL — ABNORMAL HIGH (ref 70–99)
Potassium: 4.9 mmol/L (ref 3.5–5.1)
Sodium: 141 mmol/L (ref 135–145)

## 2020-01-10 MED ORDER — TRAMADOL HCL 50 MG PO TABS: 50.0000 mg | ORAL_TABLET | Freq: Four times a day (QID) | ORAL | 0 refills | Status: AC | PRN

## 2020-01-10 MED ORDER — TRAMADOL HCL 50 MG PO TABS
50.0000 mg | ORAL_TABLET | Freq: Once | ORAL | Status: AC
  Administered 2020-01-10: 21:00:00 50 mg via ORAL
  Filled 2020-01-10: qty 1

## 2020-01-10 NOTE — Discharge Instructions (Addendum)
Please seek medical attention for any high fevers, chest pain, shortness of breath, change in behavior, persistent vomiting, bloody stool or any other new or concerning symptoms.  

## 2020-01-10 NOTE — ED Provider Notes (Addendum)
[facility] Emergency Department Provider Note   ____________________________________________   I have reviewed the triage vital signs and the nursing notes.   HISTORY  Chief Complaint Jaw Pain and Back Pain   History limited by: Not Limited   HPI Chad Allen is a 79 y.o. male who presents to the emergency department today because of concern for jaw pain. The patient states that it has been bad for the past 5 days. Located in his left upper and lower jaw. Patient denies any trauma or biting on any hard object. The patient does have history of trigeminal neuralgia. He is unsure if he is still receiving the medication for it. He also is complaining of some back pain. States that this is a chronic issue and he has had the back pain for a long time.   Records reviewed. Per medical record review patient has a history of trigeminal neuralgia.  Past Medical History:  Diagnosis Date  . Abnormal posture   . Dementia ([place])   . Dependence on continuous supplemental oxygen    2L O2 by [place]  . DNR (do not resuscitate)   . Feeding by G-tube ([place])   . Generalized muscle weakness   . GERD (gastroesophageal reflux disease)   . Hyperlipidemia   . Hypertension   . Malignant neoplasm of prostate ([place])   . Mood disorder ([place])   . Osteoarthritis   . PTSD (post-traumatic stress disorder)   . Trigeminal neuralgia   . Type 2 diabetes mellitus without complications ([place])   . Unsteadiness on feet     Patient Active Problem List   Diagnosis Date Noted  . Encephalopathy acute   . Pressure injury of skin 12/22/2019  . Trigeminal neuralgia   . Decubitus ulcer of sacral region, stage 2 ([place])   . Hyperlipidemia   . Mood disorder ([place])   . DNR (do not resuscitate)   . GERD (gastroesophageal reflux disease)   . AKI (acute kidney injury) ([place])   . Hyperkalemia   . LBBB (left bundle branch block)   . Hypokalemia 12/06/2019  . Hypomagnesemia 12/06/2019  . Mouth pain  12/06/2019  . Acute metabolic encephalopathy [id number]  . Sepsis ([place]) 12/05/2019  . UTI (urinary tract infection) 12/05/2019  . 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 Dementia without behavioral disturbance ([place]) 12/05/2019  . Acute on chronic respiratory failure with hypoxia ([place]) 12/05/2019  . Essential hypertension 12/05/2019    Past Surgical History:  Procedure Laterality Date  . GASTROSTOMY      Prior to Admission medications   Medication Sig Start Date End Date Taking? Authorizing Provider  acetaminophen (TYLENOL) 325 MG tablet Take 650 mg by mouth every 6 (six) hours as needed.    [provider]  ALPRAZolam Duanne Moron) 0.5 MG tablet Take 0.5 mg by mouth every 8 (eight) hours as needed for anxiety.    [provider]  amLODipine (NORVASC) 10 MG tablet Take 1 tablet (10 mg total) by mouth daily. Patient taking differently: Take 10 mg by mouth daily. HOLD FOR SBP<110 AND HR<60 12/10/19   Nicole Kindred A, DO  atorvastatin (LIPITOR) 20 MG tablet Take 20 mg by mouth at bedtime.    [provider]  carbamazepine (TEGRETOL) 100 MG chewable tablet Chew 100 mg by mouth 4 (four) times daily. Take with 200mg  tablet for a total dose of 300mg  daily.    [provider]  carbamazepine (TEGRETOL) 200 MG tablet Take 200 mg by mouth 4 (four) times daily. Take  with 100mg  tablet for a total dose of 300mg  daily    [provider]  Cholecalciferol (D3-1000) 25 MCG (1000 UT) capsule Take 1,000 Units by mouth daily.    [provider]  donepezil (ARICEPT) 10 MG tablet Take 10 mg by mouth at bedtime.    [provider]  DULoxetine (CYMBALTA) 30 MG capsule Take 30 mg by mouth daily.    [provider]  gabapentin (NEURONTIN) 100 MG capsule Take 2 capsules (200 mg total) by mouth 2 (two) times daily. 12/09/19 12/08/20  Nicole Kindred A, DO  insulin aspart (NOVOLOG) 100 UNIT/ML injection Inject 0-9 Units into the skin every 4 (four)  hours. Patient taking differently: Inject 0-12 Units into the skin every 4 (four) hours. Sliding scale 12/09/19   Nicole Kindred A, DO  Lacosamide (VIMPAT) 150 MG TABS Take 150 mg by mouth 2 (two) times daily.     [provider]  latanoprost (XALATAN) 0.005 % ophthalmic solution Place 1 drop into both eyes at bedtime.    [provider]  Lidocaine HCl (ASPERCREME W/LIDOCAINE) 4 % CREA Apply topically 2 (two) times daily. Apply to lower back twice daily for pain    [provider]  magnesium oxide (MAG-OX) 400 MG tablet Take 400 mg by mouth daily.    [provider]  multivitamin-lutein (OCUVITE-LUTEIN) CAPS capsule Take 1 capsule by mouth daily. 12/25/19   Fritzi Mandes, MD  mupirocin ointment (BACTROBAN) 2 % Place 1 application into the nose daily. Apply to PEG tube insertion site following cleansing with wound cleanser daily until healed.    [provider]  pantoprazole (PROTONIX) 20 MG tablet Take 20 mg by mouth 2 (two) times daily.    [provider]  QUEtiapine (SEROQUEL) 25 MG tablet Take 25 mg by mouth at bedtime.    [provider]  traMADol (ULTRAM) 50 MG tablet Take 1 tablet (50 mg total) by mouth every 6 (six) hours as needed. 01/10/20 01/09/21  Nance Pear, MD  traZODone (DESYREL) 150 MG tablet Take 150 mg by mouth at bedtime.    [provider]  Water For Irrigation, Sterile (FREE WATER) SOLN Place 150 mLs into feeding tube every 4 (four) hours. 12/09/19   Nicole Kindred A, DO    Allergies Lisinopril, Amitriptyline, Celexa [citalopram], Fluoxetine, Paroxetine, and Zoloft [sertraline]  No family history on file.  Social History Social History   Tobacco Use  . Smoking status: Unknown If Ever Smoked  Substance Use Topics  . Alcohol use: Not on file  . Drug use: Not on file    Review of Systems Constitutional: No fever/chills Eyes: No visual changes. ENT: Positive for left jaw pain.  Cardiovascular:  Denies chest pain. Respiratory: Denies shortness of breath. Gastrointestinal: No abdominal pain.  No nausea, no vomiting.  No diarrhea.   Genitourinary: Negative for dysuria. Musculoskeletal: Positive for back pain. Skin: Negative for rash. Neurological: Chronic right face weakness.  ____________________________________________   PHYSICAL EXAM:  VITAL SIGNS: ED Triage Vitals  Enc Vitals Group     BP 01/10/20 1806 140/74     Pulse Rate 01/10/20 1806 (!) 122     Resp 01/10/20 1806 18     Temp 01/10/20 1806 98.4 F (36.9 C)     Temp Source 01/10/20 1806 Axillary     SpO2 01/10/20 1804 94 %     Weight 01/10/20 1811 200 lb (90.7 kg)     Height 01/10/20 1811 5\' 7"  (1.702 m)  Head Circumference --      Peak Flow --      Pain Score 01/10/20 1810 6    Constitutional: Alert and oriented.  Eyes: Conjunctivae are normal.  ENT      Head: Normocephalic and atraumatic.      Nose: No congestion/rhinnorhea.      Mouth/Throat: Mucous membranes are moist.      Neck: No stridor. Hematological/Lymphatic/Immunilogical: No cervical lymphadenopathy. Cardiovascular: Normal rate, regular rhythm.  No murmurs, rubs, or gallops.  Respiratory: Normal respiratory effort without tachypnea nor retractions. Breath sounds are clear and equal bilaterally. No wheezes/rales/rhonchi. Gastrointestinal: Soft and non tender. Feeding tube in place.  Genitourinary: Deferred Musculoskeletal: Normal range of motion in all extremities.  Neurologic:  Right facial weakness and drooling (pt states chronic).  Skin:  Skin is warm, dry and intact. No rash noted. Psychiatric: Mood and affect are normal. Speech and behavior are normal. Patient exhibits appropriate insight and judgment.  ____________________________________________    LABS (pertinent positives/negatives)  None  ____________________________________________   EKG  None  ____________________________________________     RADIOLOGY  None  ____________________________________________   PROCEDURES  Procedures  ____________________________________________   INITIAL IMPRESSION / ASSESSMENT AND PLAN / ED COURSE  Pertinent labs & imaging results that were available during my care of the patient were reviewed by me and considered in my medical decision making (see chart for details).   Patient presented to the emergency department today because of concern for left jaw pain. Patient states it reminds him of his trigeminal neuralgia. Per review of paperwork it appears patient is still on carbamazepine. It does appear he has received tramadol in the past when the pain gets bad. He says that he would be willing to try that again. Will give patient dose of tramadol here in the emergency department.   When nurse called living facility to make them aware of discharge they stated they were concerned about right facial drooping.  However patient states that he has history of right facial weakness and drooling.  Nursing staff at living facility state that they were aware of his drooling but had not noticed any right facial weakness.  This point is be unclear why the patient would be drooling if he did not have facial weakness however given their concern for an acute change in the patient's neurologic status will initiate a stroke work-up.  Per nursing staff at living facility was last seen normal last night.  CT head negative. Patient did not tolerate MRI. At this point I do have low suspicion for acute stroke given that the drooling has been present for a long time. Do think it is reasonable to have patient be discharged to follow up with PCP.  ____________________________________________   FINAL CLINICAL IMPRESSION(S) / ED DIAGNOSES  Final diagnoses:  Chronic low back pain, unspecified back pain laterality, unspecified whether sciatica present  Trigeminal neuralgia     Note: This dictation was prepared with  Dragon dictation. Any transcriptional errors that result from this process are unintentional     Nance Pear, MD 01/10/20 2112    Nance Pear, MD 01/10/20 [id number]    Nance Pear, MD 01/10/20 2351

## 2020-01-10 NOTE — ED Notes (Signed)
Pt to MRI

## 2020-01-10 NOTE — ED Notes (Signed)
Patient got to MRI, ripped face mask and oxygen off said he felt dizzy. When he got back to room this RN asked if he just got dizzy, he said no it's "been a while". VSS, MD aware. Patient says dizziness is not new

## 2020-01-10 NOTE — ED Triage Notes (Addendum)
Pt from [facility] with lower back pain and left lower and upper jaw pain. Pt with  right side facial droop and drooling, pt states this is not new. Pt alert and oriented x 4.Able to state that he is at the hospital, but not which one. Oriented to situation.  Per EMS [facility] states this pain has happened before.

## 2020-01-10 NOTE — ED Notes (Signed)
Attempted to call report to [facility]- was put on hold and waited approx 7 min before hanging up. Will attempt again shortly

## 2020-01-10 NOTE — ED Notes (Signed)
Pt to ct 

## 2020-01-10 NOTE — ED Notes (Signed)
Pt states mouth drooping, trouble speaking and drooling began 3 months ago. Pt alert, no apparent distress.

## 2020-01-10 NOTE — ED Notes (Signed)
Patient transported to MRI 

## 2020-01-10 NOTE — ED Notes (Signed)
This RN spoke with RN at [facility], states facial droop is new, pt with last known time without facial droop 01/09/20 pm. States that drooling and facial weakness are normal. EDP notified.

## 2020-01-11 NOTE — ED Notes (Signed)
Patient's brief changed and cleaned up, new brief applied. Patient given blankets, notified that he is going home

## 2020-01-11 NOTE — ED Notes (Signed)
Called report to [facility]. Attempted to suction patient's mouth, refused and pushed my hand away. Gave patient tissues for mouth

## 2020-01-11 NOTE — ED Notes (Signed)
Attempted to call report again to [facility], waited on hold 5 minutes before hanging up. No one answered

## 2020-01-22 ENCOUNTER — Inpatient Hospital Stay
Admission: EM | Admit: 2020-01-22 | Discharge: 2020-02-18 | DRG: 951 | Disposition: E | Payer: No Typology Code available for payment source | Source: Skilled Nursing Facility | Attending: Internal Medicine | Admitting: Internal Medicine

## 2020-01-22 ENCOUNTER — Other Ambulatory Visit: Payer: Self-pay

## 2020-01-22 ENCOUNTER — Emergency Department: Payer: No Typology Code available for payment source

## 2020-01-22 DIAGNOSIS — Z794 Long term (current) use of insulin: Secondary | ICD-10-CM

## 2020-01-22 DIAGNOSIS — Z9981 Dependence on supplemental oxygen: Secondary | ICD-10-CM

## 2020-01-22 DIAGNOSIS — E872 Acidosis: Secondary | ICD-10-CM | POA: Diagnosis present

## 2020-01-22 DIAGNOSIS — Z20822 Contact with and (suspected) exposure to covid-19: Secondary | ICD-10-CM | POA: Diagnosis present

## 2020-01-22 DIAGNOSIS — I959 Hypotension, unspecified: Secondary | ICD-10-CM | POA: Diagnosis present

## 2020-01-22 DIAGNOSIS — Z515 Encounter for palliative care: Secondary | ICD-10-CM | POA: Diagnosis not present

## 2020-01-22 DIAGNOSIS — F0390 Unspecified dementia without behavioral disturbance: Secondary | ICD-10-CM | POA: Diagnosis present

## 2020-01-22 DIAGNOSIS — J690 Pneumonitis due to inhalation of food and vomit: Secondary | ICD-10-CM | POA: Diagnosis present

## 2020-01-22 DIAGNOSIS — R6521 Severe sepsis with septic shock: Secondary | ICD-10-CM

## 2020-01-22 DIAGNOSIS — N179 Acute kidney failure, unspecified: Secondary | ICD-10-CM | POA: Diagnosis not present

## 2020-01-22 DIAGNOSIS — G500 Trigeminal neuralgia: Secondary | ICD-10-CM | POA: Diagnosis present

## 2020-01-22 DIAGNOSIS — Z8546 Personal history of malignant neoplasm of prostate: Secondary | ICD-10-CM

## 2020-01-22 DIAGNOSIS — J9600 Acute respiratory failure, unspecified whether with hypoxia or hypercapnia: Secondary | ICD-10-CM | POA: Diagnosis present

## 2020-01-22 DIAGNOSIS — D649 Anemia, unspecified: Secondary | ICD-10-CM | POA: Diagnosis present

## 2020-01-22 DIAGNOSIS — A419 Sepsis, unspecified organism: Secondary | ICD-10-CM | POA: Diagnosis not present

## 2020-01-22 DIAGNOSIS — Z79899 Other long term (current) drug therapy: Secondary | ICD-10-CM

## 2020-01-22 DIAGNOSIS — J9601 Acute respiratory failure with hypoxia: Secondary | ICD-10-CM

## 2020-01-22 DIAGNOSIS — K219 Gastro-esophageal reflux disease without esophagitis: Secondary | ICD-10-CM | POA: Diagnosis present

## 2020-01-22 DIAGNOSIS — E1122 Type 2 diabetes mellitus with diabetic chronic kidney disease: Secondary | ICD-10-CM | POA: Diagnosis present

## 2020-01-22 DIAGNOSIS — E785 Hyperlipidemia, unspecified: Secondary | ICD-10-CM | POA: Diagnosis present

## 2020-01-22 DIAGNOSIS — I129 Hypertensive chronic kidney disease with stage 1 through stage 4 chronic kidney disease, or unspecified chronic kidney disease: Secondary | ICD-10-CM | POA: Diagnosis present

## 2020-01-22 DIAGNOSIS — N1832 Chronic kidney disease, stage 3b: Secondary | ICD-10-CM | POA: Diagnosis present

## 2020-01-22 DIAGNOSIS — N189 Chronic kidney disease, unspecified: Secondary | ICD-10-CM

## 2020-01-22 DIAGNOSIS — Z66 Do not resuscitate: Secondary | ICD-10-CM | POA: Diagnosis present

## 2020-01-22 DIAGNOSIS — Z931 Gastrostomy status: Secondary | ICD-10-CM

## 2020-01-22 LAB — CBC WITH DIFFERENTIAL/PLATELET
Abs Immature Granulocytes: 0.07 10*3/uL (ref 0.00–0.07)
Basophils Absolute: 0.1 10*3/uL (ref 0.0–0.1)
Basophils Relative: 1 %
Eosinophils Absolute: 0.4 10*3/uL (ref 0.0–0.5)
Eosinophils Relative: 2 %
HCT: 34.3 % — ABNORMAL LOW (ref 39.0–52.0)
Hemoglobin: 10.6 g/dL — ABNORMAL LOW (ref 13.0–17.0)
Immature Granulocytes: 1 %
Lymphocytes Relative: 14 %
Lymphs Abs: 2.2 10*3/uL (ref 0.7–4.0)
MCH: 27.9 pg (ref 26.0–34.0)
MCHC: 30.9 g/dL (ref 30.0–36.0)
MCV: 90.3 fL (ref 80.0–100.0)
Monocytes Absolute: 1 10*3/uL (ref 0.1–1.0)
Monocytes Relative: 7 %
Neutro Abs: 11.4 10*3/uL — ABNORMAL HIGH (ref 1.7–7.7)
Neutrophils Relative %: 75 %
Platelets: 551 10*3/uL — ABNORMAL HIGH (ref 150–400)
RBC: 3.8 MIL/uL — ABNORMAL LOW (ref 4.22–5.81)
RDW: 15 % (ref 11.5–15.5)
WBC: 15.1 10*3/uL — ABNORMAL HIGH (ref 4.0–10.5)
nRBC: 0 % (ref 0.0–0.2)

## 2020-01-22 LAB — COMPREHENSIVE METABOLIC PANEL
ALT: 15 U/L (ref 0–44)
AST: 21 U/L (ref 15–41)
Albumin: 3.1 g/dL — ABNORMAL LOW (ref 3.5–5.0)
Alkaline Phosphatase: 103 U/L (ref 38–126)
Anion gap: 11 (ref 5–15)
BUN: 32 mg/dL — ABNORMAL HIGH (ref 8–23)
CO2: 24 mmol/L (ref 22–32)
Calcium: 9.5 mg/dL (ref 8.9–10.3)
Chloride: 99 mmol/L (ref 98–111)
Creatinine, Ser: 1.68 mg/dL — ABNORMAL HIGH (ref 0.61–1.24)
GFR calc Af Amer: 45 mL/min — ABNORMAL LOW (ref >60)
GFR calc non Af Amer: 39 mL/min — ABNORMAL LOW (ref >60)
Glucose, Bld: 215 mg/dL — ABNORMAL HIGH (ref 70–99)
Potassium: 4.9 mmol/L (ref 3.5–5.1)
Sodium: 134 mmol/L — ABNORMAL LOW (ref 135–145)
Total Bilirubin: 0.5 mg/dL (ref 0.3–1.2)
Total Protein: 7.6 g/dL (ref 6.5–8.1)

## 2020-01-22 LAB — RESPIRATORY PANEL BY RT PCR (FLU A&B, COVID)
Influenza A by PCR: NEGATIVE
Influenza B by PCR: NEGATIVE
SARS Coronavirus 2 by RT PCR: NEGATIVE

## 2020-01-22 LAB — LACTIC ACID, PLASMA: Lactic Acid, Venous: 2.4 mmol/L (ref 0.5–1.9)

## 2020-01-22 LAB — PROTIME-INR
INR: 1.2 (ref 0.8–1.2)
Prothrombin Time: 14.8 seconds (ref 11.4–15.2)

## 2020-01-22 LAB — APTT: aPTT: 43 seconds — ABNORMAL HIGH (ref 24–36)

## 2020-01-22 LAB — BRAIN NATRIURETIC PEPTIDE: B Natriuretic Peptide: 48 pg/mL (ref 0.0–100.0)

## 2020-01-22 LAB — PROCALCITONIN: Procalcitonin: 0.47 ng/mL

## 2020-01-22 LAB — LIPASE, BLOOD: Lipase: 49 U/L (ref 11–51)

## 2020-01-22 LAB — TROPONIN I (HIGH SENSITIVITY): Troponin I (High Sensitivity): 8 ng/L (ref <18)

## 2020-01-22 LAB — GLUCOSE, CAPILLARY: Glucose-Capillary: 211 mg/dL — ABNORMAL HIGH (ref 70–99)

## 2020-01-22 MED ORDER — CARBAMAZEPINE 100 MG PO CHEW
100.0000 mg | CHEWABLE_TABLET | Freq: Four times a day (QID) | ORAL | Status: DC
  Administered 2020-01-23: 11:00:00 100 mg via ORAL
  Filled 2020-01-22 (×5): qty 1

## 2020-01-22 MED ORDER — SODIUM CHLORIDE 0.9 % IV BOLUS
1000.0000 mL | Freq: Once | INTRAVENOUS | Status: AC
  Administered 2020-01-22: 19:00:00 1000 mL via INTRAVENOUS

## 2020-01-22 MED ORDER — FENTANYL CITRATE (PF) 100 MCG/2ML IJ SOLN
25.0000 ug | Freq: Once | INTRAMUSCULAR | Status: AC
  Administered 2020-01-22: 20:00:00 25 ug via INTRAVENOUS
  Filled 2020-01-22: qty 2

## 2020-01-22 MED ORDER — LORAZEPAM 2 MG/ML IJ SOLN: 1.0000 mg | INTRAMUSCULAR | Status: DC | PRN

## 2020-01-22 MED ORDER — ACETAMINOPHEN 650 MG RE SUPP: 650.0000 mg | RECTAL | Status: DC | PRN

## 2020-01-22 MED ORDER — MORPHINE SULFATE (PF) 2 MG/ML IV SOLN: 1.0000 mg | INTRAVENOUS | Status: DC | PRN

## 2020-01-22 MED ORDER — VANCOMYCIN HCL IN DEXTROSE 1-5 GM/200ML-% IV SOLN
1000.0000 mg | Freq: Once | INTRAVENOUS | Status: AC
  Administered 2020-01-22: 1000 mg via INTRAVENOUS
  Filled 2020-01-22: qty 200

## 2020-01-22 MED ORDER — MORPHINE SULFATE (PF) 2 MG/ML IV SOLN
2.0000 mg | INTRAVENOUS | Status: AC | PRN
  Administered 2020-01-22 – 2020-01-23 (×3): 2 mg via INTRAVENOUS
  Filled 2020-01-22 (×3): qty 1

## 2020-01-22 MED ORDER — ACETAMINOPHEN 325 MG PO TABS: 650.0000 mg | ORAL_TABLET | ORAL | Status: DC | PRN

## 2020-01-22 MED ORDER — SODIUM CHLORIDE 0.9 % IV SOLN
1.0000 g | Freq: Once | INTRAVENOUS | Status: AC
  Administered 2020-01-22: 23:00:00 1 g via INTRAVENOUS
  Filled 2020-01-22: qty 1

## 2020-01-22 MED ORDER — GABAPENTIN 100 MG PO CAPS
200.0000 mg | ORAL_CAPSULE | Freq: Two times a day (BID) | ORAL | Status: DC
  Administered 2020-01-23: 11:00:00 200 mg via ORAL
  Filled 2020-01-22: qty 2

## 2020-01-22 MED ORDER — LACOSAMIDE 50 MG PO TABS
150.0000 mg | ORAL_TABLET | Freq: Two times a day (BID) | ORAL | Status: DC
  Filled 2020-01-22: qty 3

## 2020-01-22 MED ORDER — CARBAMAZEPINE 200 MG PO TABS
200.0000 mg | ORAL_TABLET | Freq: Four times a day (QID) | ORAL | Status: DC
  Administered 2020-01-23: 11:00:00 200 mg via ORAL
  Filled 2020-01-22 (×5): qty 1

## 2020-01-22 NOTE — ED Provider Notes (Addendum)
[facility] Emergency Department Provider Note  ____________________________________________   First MD Initiated Contact with Patient 01/21/2020 1815     (approximate)  I have reviewed the triage vital signs and the nursing notes.   HISTORY  Chief Complaint AMS    HPI Chad Allen is a 79 y.o. male with dementia, G-tube dependent who comes in from [facility] for altered mental status and hypoxia.  Patient was found to be satting in the 45s.  Patient was 96 on a nonrebreather.  Patient had another vomiting episode and patient was immediately suctioned out.  Unable to get full HPI due to patient's altered mental status.          Past Medical History:  Diagnosis Date  . Abnormal posture   . Dementia ([place])   . Dependence on continuous supplemental oxygen    2L O2 by [place]  . DNR (do not resuscitate)   . Feeding by G-tube ([place])   . Generalized muscle weakness   . GERD (gastroesophageal reflux disease)   . Hyperlipidemia   . Hypertension   . Malignant neoplasm of prostate ([place])   . Mood disorder ([place])   . Osteoarthritis   . PTSD (post-traumatic stress disorder)   . Trigeminal neuralgia   . Type 2 diabetes mellitus without complications ([place])   . Unsteadiness on feet     Patient Active Problem List   Diagnosis Date Noted  . Encephalopathy acute   . Pressure injury of skin 12/22/2019  . Trigeminal neuralgia   . Decubitus ulcer of sacral region, stage 2 ([place])   . Hyperlipidemia   . Mood disorder ([place])   . DNR (do not resuscitate)   . GERD (gastroesophageal reflux disease)   . AKI (acute kidney injury) ([place])   . Hyperkalemia   . LBBB (left bundle branch block)   . Hypokalemia 12/06/2019  . Hypomagnesemia 12/06/2019  . Mouth pain 12/06/2019  . Acute metabolic encephalopathy [id number]  . Sepsis ([place]) 12/05/2019  . UTI (urinary tract infection) 12/05/2019  . 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 Dementia without behavioral  disturbance ([place]) 12/05/2019  . Acute on chronic respiratory failure with hypoxia ([place]) 12/05/2019  . Essential hypertension 12/05/2019    Past Surgical History:  Procedure Laterality Date  . GASTROSTOMY      Prior to Admission medications   Medication Sig Start Date End Date Taking? Authorizing Provider  acetaminophen (TYLENOL) 325 MG tablet Take 650 mg by mouth every 6 (six) hours as needed.    [provider]  ALPRAZolam Duanne Moron) 0.5 MG tablet Take 0.5 mg by mouth every 8 (eight) hours as needed for anxiety.    [provider]  amLODipine (NORVASC) 10 MG tablet Take 1 tablet (10 mg total) by mouth daily. Patient taking differently: Take 10 mg by mouth daily. HOLD FOR SBP<110 AND HR<60 12/10/19   Nicole Kindred A, DO  atorvastatin (LIPITOR) 20 MG tablet Take 20 mg by mouth at bedtime.    [provider]  carbamazepine (TEGRETOL) 100 MG chewable tablet Chew 100 mg by mouth 4 (four) times daily. Take with 200mg  tablet for a total dose of 300mg  daily.    [provider]  carbamazepine (TEGRETOL) 200 MG tablet Take 200 mg by mouth 4 (four) times daily. Take with 100mg  tablet for a total dose of 300mg  daily    [provider]  Cholecalciferol (D3-1000) 25 MCG (1000 UT) capsule Take 1,000 Units by mouth daily.    [provider]  donepezil (ARICEPT) 10 MG tablet Take 10 mg by mouth at bedtime.    [provider]  DULoxetine (CYMBALTA) 30 MG capsule Take 30 mg by mouth daily.    [provider]  gabapentin (NEURONTIN) 100 MG capsule Take 2 capsules (200 mg total) by mouth 2 (two) times daily. 12/09/19 12/08/20  Nicole Kindred A, DO  insulin aspart (NOVOLOG) 100 UNIT/ML injection Inject 0-9 Units into the skin every 4 (four) hours. Patient taking differently: Inject 0-12 Units into the skin every 4 (four) hours. Sliding scale 12/09/19   Nicole Kindred A, DO  Lacosamide (VIMPAT) 150 MG TABS Take 150 mg by mouth 2 (two) times  daily.     [provider]  latanoprost (XALATAN) 0.005 % ophthalmic solution Place 1 drop into both eyes at bedtime.    [provider]  Lidocaine HCl (ASPERCREME W/LIDOCAINE) 4 % CREA Apply topically 2 (two) times daily. Apply to lower back twice daily for pain    [provider]  magnesium oxide (MAG-OX) 400 MG tablet Take 400 mg by mouth daily.    [provider]  multivitamin-lutein (OCUVITE-LUTEIN) CAPS capsule Take 1 capsule by mouth daily. 12/25/19   Fritzi Mandes, MD  mupirocin ointment (BACTROBAN) 2 % Place 1 application into the nose daily. Apply to PEG tube insertion site following cleansing with wound cleanser daily until healed.    [provider]  pantoprazole (PROTONIX) 20 MG tablet Take 20 mg by mouth 2 (two) times daily.    [provider]  QUEtiapine (SEROQUEL) 25 MG tablet Take 25 mg by mouth at bedtime.    [provider]  traMADol (ULTRAM) 50 MG tablet Take 1 tablet (50 mg total) by mouth every 6 (six) hours as needed. 01/10/20 01/09/21  Nance Pear, MD  traZODone (DESYREL) 150 MG tablet Take 150 mg by mouth at bedtime.    [provider]  Water For Irrigation, Sterile (FREE WATER) SOLN Place 150 mLs into feeding tube every 4 (four) hours. 12/09/19   Nicole Kindred A, DO    Allergies Lisinopril, Amitriptyline, Celexa [citalopram], Fluoxetine, Paroxetine, and Zoloft [sertraline]  No family history on file.  Social History Social History   Tobacco Use  . Smoking status: Unknown If Ever Smoked  Substance Use Topics  . Alcohol use: Not on file  . Drug use: Not on file      Review of Systems Unable to get full review of systems due to patient's altered mental status. ____________________________________________   PHYSICAL EXAM:  VITAL SIGNS: ED Triage Vitals  Enc Vitals Group     BP 01/24/2020 1835 95/60     Pulse Rate 01/25/2020 1822 (!) 106     Resp 01/28/2020 1822 20     Temp 02/01/2020 1822  97.9 F (36.6 C)     Temp Source 02/16/2020 1822 Axillary     SpO2 01/31/2020 1817 (!) 76 %     Weight 02/08/2020 1818 198 lb 6.6 oz (90 kg)     Height 02/08/2020 1818 5\' 7"  (1.702 m)     Head Circumference --      Peak Flow --      Pain Score 01/28/2020 1818 0     Pain Loc --      Pain Edu? --      Excl. in [place]? --     Constitutional: Altered, slightly diaphoretic Eyes: Conjunctivae are normal.  Looking around the room Head: Atraumatic. Nose: No congestion/rhinnorhea. Mouth/Throat: Mucous membranes are moist.  Neck: No stridor. Trachea Midline. FROM Cardiovascular: Normal rate, regular rhythm. Grossly normal heart sounds.  Good peripheral circulation. Respiratory: Increased work of breathing with bilateral rhonchi with gurgling, satting in the 70s. Gastrointestinal: Soft and nontender. No distention. No abdominal bruits.  G-tube without erythema Musculoskeletal: No lower extremity tenderness nor edema.  No joint effusions. Neurologic: Altered.  Looking around the room.  Unable to follow commands Skin:  Skin is warm, dry and intact. No rash noted. Psychiatric: Unable to fully assess due to altered mental status GU: Deferred   ____________________________________________   LABS (all labs ordered are listed, but only abnormal results are displayed)  Labs Reviewed  CBC WITH DIFFERENTIAL/PLATELET - Abnormal; Notable for the following components:      Result Value   WBC 15.1 (*)    RBC 3.80 (*)    Hemoglobin 10.6 (*)    HCT 34.3 (*)    Platelets 551 (*)    Neutro Abs 11.4 (*)    All other components within normal limits  APTT - Abnormal; Notable for the following components:   aPTT 43 (*)    All other components within normal limits  GLUCOSE, CAPILLARY - Abnormal; Notable for the following components:   Glucose-Capillary 211 (*)    All other components within normal limits  CULTURE, BLOOD (ROUTINE X 2)  CULTURE, BLOOD (ROUTINE X 2)  RESPIRATORY PANEL BY RT PCR (FLU A&B, COVID)    URINE CULTURE  PROTIME-INR  COMPREHENSIVE METABOLIC PANEL  LIPASE, BLOOD  LACTIC ACID, PLASMA  LACTIC ACID, PLASMA  PROCALCITONIN  PROCALCITONIN  BRAIN NATRIURETIC PEPTIDE  URINALYSIS, ROUTINE W REFLEX MICROSCOPIC  TROPONIN I (HIGH SENSITIVITY)   ____________________________________________   ED ECG REPORT I, Vanessa [place], the attending physician, personally viewed and interpreted this ECG.  Sinus tachycardia rate of 105, no ST elevation, no T wave inversions, left bundle branch block ____________________________________________  RADIOLOGY I, Vanessa [place], personally viewed and evaluated these images (plain radiographs) as part of my medical decision making, as well as reviewing the written report by the radiologist.  ED MD interpretation: Mild infiltrate right base.   Official radiology report(s): DG Chest Portable 1 View  Result Date: 02/06/2020 CLINICAL DATA:  Shortness of breath. EXAM: PORTABLE CHEST 1 VIEW COMPARISON:  December 22, 2019. FINDINGS: The heart size and mediastinal contours are within normal limits. No pneumothorax or pleural effusion is noted. Left lung is clear. Mild right basilar subsegmental atelectasis or infiltrate is noted. The visualized skeletal structures are unremarkable. IMPRESSION: Mild right basilar subsegmental atelectasis or infiltrate. Follow-up radiographs are recommended to ensure resolution. Electronically Signed   By: Marijo Conception M.D.   On: 01/26/2020 18:47    ____________________________________________   PROCEDURES  Procedure(s) performed (including Critical Care):  .Critical Care Performed by: Vanessa [place], MD Authorized by: Vanessa [place], MD   Critical care provider statement:    Critical care time (minutes):  35   Critical care was necessary to treat or prevent imminent or life-threatening deterioration of the following conditions:  Respiratory failure   Critical care was time spent personally by me on the following  activities:  Discussions with consultants, evaluation of patient's response to treatment, examination of patient, ordering and performing treatments and interventions, ordering and review of laboratory studies, ordering and review of radiographic studies, pulse oximetry, re-evaluation of patient's condition, obtaining history from patient or surrogate and review of old charts     ____________________________________________   INITIAL IMPRESSION / ASSESSMENT AND PLAN /  ED COURSE   Chad Allen was evaluated in Emergency Department on 02/06/2020 for the symptoms described in the history of present illness. He was evaluated in the context of the global COVID-19 pandemic, which necessitated consideration that the patient might be at risk for infection with the SARS-CoV-2 virus that causes COVID-19. Institutional protocols and algorithms that pertain to the evaluation of patients at risk for COVID-19 are in a state of rapid change based on information released by regulatory bodies including the CDC and federal and state organizations. These policies and algorithms were followed during the patient's care in the ED.     Pt presents with SOB.  I am concerned most likely for aspiration given the vomiting episode.  We attempted to do a deep suction and was able to get some congestion out.  PNA-will get xray to evaluation Anemia-CBC to evaluate ACS- will get trops Arrhythmia-Will get EKG and keep on monitor.  COVID- will get testing per algorithm. PE-lower suspicion given no risk factors and other cause more likely  Did initially call patient's daughter when he got there and patient is DNR  Discussed with respiratory and they placed patient on a nonrebreather and high flow nasal cannula but still satting in the 43s.  I rediscussed with family and he remains DNR.  I do not think BiPAP would be beneficial at this time given the vomiting and the altered mental status.  Family does not want to progress  to intubation therefore I think patient would best supported with comfort care.  We will give some antibiotics in case there was aspiration.  Family is on their way.  Will discuss with hospital team for admission for comfort care.  7:24 PM discussed with the hospitalist team and they want to see how he does over the next few hours to see if he will pass away here if not they will admit him.  We called daughter back and she is aware of his critical condition.  She is okay with [place] starting to give pain medication if he seems to be uncomfortable.  She understand that he may pass away prior to them getting here.  8:03 PM discussed with hospital team and they will admit patient for end-of-life care.    ____________________________________________   FINAL CLINICAL IMPRESSION(S) / ED DIAGNOSES   Final diagnoses:  Acute respiratory failure with hypoxia (HCC)  Aspiration pneumonia, unspecified aspiration pneumonia type, unspecified laterality, unspecified part of lung ([place])     MEDICATIONS GIVEN DURING THIS VISIT:  Medications  meropenem (MERREM) 1 g in sodium chloride 0.9 % 100 mL IVPB (has no administration in time range)  morphine 2 MG/ML injection 2 mg (has no administration in time range)  vancomycin (VANCOCIN) IVPB 1000 mg/200 mL premix (1,000 mg Intravenous New Bag/Given 02/16/2020 1902)  sodium chloride 0.9 % bolus 1,000 mL (1,000 mLs Intravenous New Bag/Given 02/04/2020 1903)  fentaNYL (SUBLIMAZE) injection 25 mcg (25 mcg Intravenous Given 02/08/2020 1932)     ED Discharge Orders    None       Note:  This document was prepared using Dragon voice recognition software and may include unintentional dictation errors.   Vanessa [place], MD 02/08/2020 2004    Vanessa [place], MD 02/21/20 [phone]

## 2020-01-22 NOTE — ED Notes (Signed)
Pt pulling at NRB and moaning. Dr. Jari Pigg notified. Fentanyl ordered

## 2020-01-22 NOTE — ED Notes (Signed)
Pt 76% SpO2 while on high flow and NRB. RT and Dr Jari Pigg to bedside

## 2020-01-22 NOTE — ED Notes (Signed)
Report provided to [place], Therapist, sports. Both daughter and son will be accompanying pt. AC and charge on 1A made aware.

## 2020-01-22 NOTE — ED Notes (Signed)
RT at bedsde

## 2020-01-22 NOTE — ED Notes (Signed)
Pt placed on NRB.

## 2020-01-22 NOTE — ED Notes (Signed)
Dr Jari Pigg notified of lactic and B\P 79/62. Bolus ordered

## 2020-01-22 NOTE — ED Notes (Addendum)
Pt 78% on RA. Placed on 6L [place]. Dr Jari Pigg at bedside. [place] Flow RN to perform deep suction. Labored abdominal breathing noted

## 2020-01-22 NOTE — ED Triage Notes (Signed)
Pt to ED via [facility] from [facility] for chief complaint of AMS and hypoxia. EMS reports pt 80's RA. 98% on NRB.  EMS reports crackers bilaterally.  Pt vomited on arrival. Crackles and wheezes heard.

## 2020-01-22 NOTE — ED Notes (Signed)
Son at bedside.

## 2020-01-22 NOTE — ED Notes (Signed)
Pt/family denies any needs at this time

## 2020-01-22 NOTE — H&P (Addendum)
[facility] [place] NAME: Chad Allen    MR#:  [id number]  DATE OF BIRTH:  1941-07-16  DATE OF ADMISSION:  02/13/2020  PRIMARY CARE PHYSICIAN: Alvester Morin, MD   REQUESTING/REFERRING PHYSICIAN: Marjean Donna, MD CHIEF COMPLAINT:   Chief Complaint  Patient presents with  . AMS    HISTORY OF PRESENT ILLNESS:  Chad Allen  is a 79 y.o. African-American male with a known history of hypertension, dyslipidemia, type 2 diabetes mellitus, trigeminal neuralgia and dementia status post G-tube placement, who is a resident of [facility] skilled nursing facility, who presented to the emergency room with acute onset of altered mental status with associated hypoxia with pulse oximetry that was down to the 80s and later up to 96% on her percent nonrebreather.  The patient had a vomiting episode that was immediately suctioned.  No other history could be obtained from the patient as he was mentally obtunded and nonverbal.  Upon presentation to the emergency room, heart rate was 106 and pulse oximetry 75% on 6 L of O2 by nasal cannula with a blood pressure of 95/60 and later blood pressure dropped to 73/60 and pulse oximetry later on was 83% on 55 L by high flow nasal cannula.  Labs revealed a BUN of 32 and creatinine 1.68 compared to 30 and 1.58 on 01/10/2020, BNP of 48 and high-sensitivity troponin I of 8 with lactic acid of 2.4 and procalcitonin 0.47.  CBC showed leukocytosis with neutrophilia as well as anemia worse than previous levels.  COVID-19 PCR came back negative and influenza antigens were negative.  Blood cultures were drawn.  Chest x-ray showed mild right basal subsegmental atelectasis or infiltrate.  The patient was given 2 mg of IV morphine sulfate, 1 g IV meropenem and 1 g of IV vancomycin as well as 25 mg of IV fentanyl.  The patient's status was discussed with the family and he decided to keep DNR status.  Patient was not a candidate for  BiPAP due to vomiting and altered mental status.  Per the ER physician discussion with family, priority was for comfort measures and they understand that the patient has a grim prognosis.  I discussed the case with the patient's son who was in the room with him and he concurred with comfort measures.  He will therefore be admitted to an observation palliative care bed for comfort measures. PAST MEDICAL HISTORY:   Past Medical History:  Diagnosis Date  . Abnormal posture   . Dementia ([place])   . Dependence on continuous supplemental oxygen    2L O2 by [place]  . DNR (do not resuscitate)   . Feeding by G-tube ([place])   . Generalized muscle weakness   . GERD (gastroesophageal reflux disease)   . Hyperlipidemia   . Hypertension   . Malignant neoplasm of prostate ([place])   . Mood disorder ([place])   . Osteoarthritis   . PTSD (post-traumatic stress disorder)   . Trigeminal neuralgia   . Type 2 diabetes mellitus without complications ([place])   . Unsteadiness on feet     PAST SURGICAL HISTORY:   Past Surgical History:  Procedure Laterality Date  . GASTROSTOMY      SOCIAL HISTORY:   Social History   Tobacco Use  . Smoking status: Unknown If Ever Smoked  Substance Use Topics  . Alcohol use: Not on file    FAMILY HISTORY:  No family history on file.  DRUG ALLERGIES:   Allergies  Allergen Reactions  . Lisinopril Swelling  . Amitriptyline   . Celexa [Citalopram]   . Fluoxetine Other (See Comments)    Tremors  . Paroxetine   . Zoloft [Sertraline]     REVIEW OF SYSTEMS:   ROS As per history of present illness. All pertinent systems were reviewed above. Constitutional,  HEENT, cardiovascular, respiratory, GI, GU, musculoskeletal, neuro, psychiatric, endocrine,  integumentary and hematologic systems were reviewed and are otherwise  negative/unremarkable except for positive findings mentioned above in the HPI.   MEDICATIONS AT HOME:   Prior to Admission medications   Medication Sig  Start Date End Date Taking? Authorizing Provider  amLODipine (NORVASC) 10 MG tablet Take 1 tablet (10 mg total) by mouth daily. Patient taking differently: Take 10 mg by mouth daily. HOLD FOR SBP<110 AND HR<60 12/10/19  Yes Nicole Kindred A, DO  atorvastatin (LIPITOR) 20 MG tablet Take 20 mg by mouth at bedtime.   Yes [provider]  carbamazepine (TEGRETOL) 100 MG chewable tablet Chew 100 mg by mouth 4 (four) times daily. Take with 200mg  tablet for a total dose of 300mg  daily.   Yes [provider]  carbamazepine (TEGRETOL) 200 MG tablet Take 200 mg by mouth 4 (four) times daily. Take with 100mg  tablet for a total dose of 300mg  daily   Yes [provider]  Cholecalciferol (D3-1000) 25 MCG (1000 UT) capsule Take 1,000 Units by mouth daily.   Yes [provider]  donepezil (ARICEPT) 10 MG tablet Take 10 mg by mouth at bedtime.   Yes [provider]  DULoxetine (CYMBALTA) 30 MG capsule Take 30 mg by mouth daily.   Yes [provider]  gabapentin (NEURONTIN) 100 MG capsule Take 2 capsules (200 mg total) by mouth 2 (two) times daily. 12/09/19 12/08/20 Yes Nicole Kindred A, DO  Lacosamide (VIMPAT) 150 MG TABS Take 150 mg by mouth 2 (two) times daily.    Yes [provider]  latanoprost (XALATAN) 0.005 % ophthalmic solution Place 1 drop into both eyes at bedtime.   Yes [provider]  Lidocaine HCl (ASPERCREME W/LIDOCAINE) 4 % CREA Apply topically 2 (two) times daily. Apply to lower back twice daily for pain   Yes [provider]  magnesium oxide (MAG-OX) 400 MG tablet Take 400 mg by mouth daily.   Yes [provider]  multivitamin-lutein (OCUVITE-LUTEIN) CAPS capsule Take 1 capsule by mouth daily. 12/25/19  Yes Fritzi Mandes, MD  mupirocin ointment (BACTROBAN) 2 % Place 1 application into the nose daily. Apply to PEG tube insertion site following cleansing with wound cleanser daily until healed.   Yes [provider]  pantoprazole (PROTONIX) 20 MG tablet Take 20 mg by mouth 2 (two) times daily.   Yes [provider]  QUEtiapine (SEROQUEL) 25 MG tablet Take 25 mg by mouth 2 (two) times daily.    Yes [provider]  traMADol (ULTRAM) 50 MG tablet Take 1 tablet (50 mg total) by mouth every 6 (six) hours as needed. 01/10/20 01/09/21 Yes Nance Pear, MD  traZODone (DESYREL) 150 MG tablet Take 150 mg by mouth at bedtime.   Yes [provider]  acetaminophen (TYLENOL) 325 MG tablet Take 650 mg by mouth every 6 (six) hours as needed.    [provider]  ALPRAZolam Duanne Moron) 0.5 MG tablet Take 0.5 mg by mouth every 8 (eight) hours as needed for anxiety.    [provider]  insulin aspart (NOVOLOG) 100 UNIT/ML injection Inject 0-9 Units into  the skin every 4 (four) hours. Patient taking differently: Inject 0-12 Units into the skin every 4 (four) hours. Sliding scale 12/09/19   Ezekiel Slocumb, DO  Water For Irrigation, Sterile (FREE WATER) SOLN Place 150 mLs into feeding tube every 4 (four) hours. 12/09/19   Ezekiel Slocumb, DO      VITAL SIGNS:  Blood pressure (!) 73/62, pulse (!) 59, temperature 97.9 F (36.6 C), temperature source Axillary, resp. rate 19, height 5\' 7"  (1.702 m), weight 90 kg, SpO2 90 %.  PHYSICAL EXAMINATION:  Physical Exam  GENERAL:  79 y.o.-year-old African-American male patient lying in the bed with mild respiratory distress 100% nonrebreather and high flow nasal cannula. EYES: Pupils equal, round, reactive to light and accommodation. No scleral icterus. Extraocular muscles intact.  HEENT: Head atraumatic, normocephalic. Oropharynx and nasopharynx clear.  NECK:  Supple, no jugular venous distention. No thyroid enlargement, no tenderness.  LUNGS: Diminished bibasal breath sounds with bibasal crackles. CARDIOVASCULAR: Regular rate and rhythm, S1, S2 normal. No murmurs, rubs, or gallops.  ABDOMEN: Soft, nondistended, nontender.  Bowel sounds present. No organomegaly or mass.  G-tube in place EXTREMITIES: No pedal edema, cyanosis, or clubbing.  NEUROLOGIC: Cranial nerves II through XII are intact. Muscle strength 5/5 in all extremities. Sensation intact. Gait not checked.  PSYCHIATRIC: The patient is alert and oriented x 3.  Normal affect and good eye contact. SKIN: No obvious rash, lesion, or ulcer.   LABORATORY PANEL:   CBC Recent Labs  Lab 02/02/2020 1824  WBC 15.1*  HGB 10.6*  HCT 34.3*  PLT 551*   ------------------------------------------------------------------------------------------------------------------  Chemistries  Recent Labs  Lab 01/29/2020 1824  NA 134*  K 4.9  CL 99  CO2 24  GLUCOSE 215*  BUN 32*  CREATININE 1.68*  CALCIUM 9.5  AST 21  ALT 15  ALKPHOS 103  BILITOT 0.5   ------------------------------------------------------------------------------------------------------------------  Cardiac Enzymes No results for input(s): TROPONINI in the last 168 hours. ------------------------------------------------------------------------------------------------------------------  RADIOLOGY:  DG Chest Portable 1 View  Result Date: 02/06/2020 CLINICAL DATA:  Shortness of breath. EXAM: PORTABLE CHEST 1 VIEW COMPARISON:  December 22, 2019. FINDINGS: The heart size and mediastinal contours are within normal limits. No pneumothorax or pleural effusion is noted. Left lung is clear. Mild right basilar subsegmental atelectasis or infiltrate is noted. The visualized skeletal structures are unremarkable. IMPRESSION: Mild right basilar subsegmental atelectasis or infiltrate. Follow-up radiographs are recommended to ensure resolution. Electronically Signed   By: Marijo Conception M.D.   On: 02/12/2020 18:47      IMPRESSION AND PLAN:   1.  Acute hypoxic respiratory failure likely secondary to aspiration pneumonia especially given G-tube and episode of vomiting.  We will continue high flow nasal cannula  and nonrebreather to provide comfort.  The patient is being admitted to a palliative care bed for comfort measures.  He will be placed on as needed IV Ativan and morphine sulfate.  I discussed his grim prognosis especially given his current hypotension with his son and his daughter who is a Chief Executive Officer and they concur with comfort measures.  2.  Leukocytosis with associated hypotension in the setting of pneumonia, concerning for sepsis and possibly septic shock.  O2 protocol comfort measures will be followed as mentioned above.  3.  Acute kidney injury on stage IIIb chronic kidney disease.  The patient was given 1 L bolus of IV normal saline and fluids were held off given decision for comfort measures.  All the records are reviewed and case  discussed with ED provider. The plan of care was discussed in details with the patient (and family including his son and daughter). I answered all questions. The patient agreed to proceed with the above mentioned plan. Further management will depend upon hospital course.   CODE STATUS: The patient is DNI/DNR.  TOTAL TIME TAKING CARE OF THIS PATIENT: 45 minutes.    Christel Mormon M.D on 01/27/2020 at 8:20 PM  [facility]   From 7 PM-7 AM, contact night-coverage [email or web address]  CC: Primary care physician; Alvester Morin, MD   Note: This dictation was prepared with Dragon dictation along with smaller phrase technology. Any transcriptional errors that result from this process are unintentional.

## 2020-01-22 NOTE — ED Notes (Signed)
Pt 84 % on NRB and  high flow [place]

## 2020-01-22 NOTE — ED Notes (Signed)
Respiratory contacted to put pt on high flow per dr Jari Pigg. Pt 78% on 6L [place]

## 2020-01-22 NOTE — Consult Note (Signed)
PHARMACY -  BRIEF ANTIBIOTIC NOTE   Pharmacy has received consult(s) for vancomycin + meropenem from an ED provider.  The patient's profile has been reviewed for ht/wt/allergies/indication/available labs. No antibiotic allergies.   One time order(s) placed for  -Vancomycin 1 g (already ordered) -Meropenem 1 g (already ordered)  Further antibiotics/pharmacy consults should be ordered by admitting physician if indicated.                       Thank you, [place] Resident 02/17/2020  6:58 PM

## 2020-01-22 NOTE — ED Notes (Signed)
Pt provided warm blankets

## 2020-01-23 ENCOUNTER — Encounter: Payer: Self-pay | Admitting: Family Medicine

## 2020-01-23 ENCOUNTER — Other Ambulatory Visit: Payer: Self-pay

## 2020-01-23 DIAGNOSIS — J9600 Acute respiratory failure, unspecified whether with hypoxia or hypercapnia: Secondary | ICD-10-CM | POA: Diagnosis present

## 2020-01-23 DIAGNOSIS — I959 Hypotension, unspecified: Secondary | ICD-10-CM | POA: Diagnosis present

## 2020-01-23 DIAGNOSIS — N1832 Chronic kidney disease, stage 3b: Secondary | ICD-10-CM | POA: Diagnosis present

## 2020-01-23 DIAGNOSIS — Z515 Encounter for palliative care: Secondary | ICD-10-CM | POA: Diagnosis present

## 2020-01-23 DIAGNOSIS — Z9981 Dependence on supplemental oxygen: Secondary | ICD-10-CM | POA: Diagnosis not present

## 2020-01-23 DIAGNOSIS — D649 Anemia, unspecified: Secondary | ICD-10-CM | POA: Diagnosis present

## 2020-01-23 DIAGNOSIS — Z79899 Other long term (current) drug therapy: Secondary | ICD-10-CM | POA: Diagnosis not present

## 2020-01-23 DIAGNOSIS — N179 Acute kidney failure, unspecified: Secondary | ICD-10-CM | POA: Diagnosis present

## 2020-01-23 DIAGNOSIS — J9601 Acute respiratory failure with hypoxia: Secondary | ICD-10-CM | POA: Diagnosis present

## 2020-01-23 DIAGNOSIS — Z794 Long term (current) use of insulin: Secondary | ICD-10-CM | POA: Diagnosis not present

## 2020-01-23 DIAGNOSIS — I129 Hypertensive chronic kidney disease with stage 1 through stage 4 chronic kidney disease, or unspecified chronic kidney disease: Secondary | ICD-10-CM | POA: Diagnosis present

## 2020-01-23 DIAGNOSIS — G500 Trigeminal neuralgia: Secondary | ICD-10-CM | POA: Diagnosis present

## 2020-01-23 DIAGNOSIS — F0390 Unspecified dementia without behavioral disturbance: Secondary | ICD-10-CM | POA: Diagnosis present

## 2020-01-23 DIAGNOSIS — J690 Pneumonitis due to inhalation of food and vomit: Secondary | ICD-10-CM | POA: Diagnosis present

## 2020-01-23 DIAGNOSIS — E785 Hyperlipidemia, unspecified: Secondary | ICD-10-CM | POA: Diagnosis present

## 2020-01-23 DIAGNOSIS — K219 Gastro-esophageal reflux disease without esophagitis: Secondary | ICD-10-CM | POA: Diagnosis present

## 2020-01-23 DIAGNOSIS — Z931 Gastrostomy status: Secondary | ICD-10-CM | POA: Diagnosis not present

## 2020-01-23 DIAGNOSIS — Z20822 Contact with and (suspected) exposure to covid-19: Secondary | ICD-10-CM | POA: Diagnosis present

## 2020-01-23 DIAGNOSIS — Z8546 Personal history of malignant neoplasm of prostate: Secondary | ICD-10-CM | POA: Diagnosis not present

## 2020-01-23 DIAGNOSIS — Z66 Do not resuscitate: Secondary | ICD-10-CM | POA: Diagnosis present

## 2020-01-23 DIAGNOSIS — E1122 Type 2 diabetes mellitus with diabetic chronic kidney disease: Secondary | ICD-10-CM | POA: Diagnosis present

## 2020-01-23 DIAGNOSIS — E872 Acidosis: Secondary | ICD-10-CM | POA: Diagnosis present

## 2020-01-23 LAB — PROCALCITONIN: Procalcitonin: 46.13 ng/mL

## 2020-01-23 MED ORDER — GLYCOPYRROLATE 0.2 MG/ML IJ SOLN
0.2000 mg | INTRAMUSCULAR | Status: DC | PRN
  Filled 2020-01-23: qty 1

## 2020-01-23 MED ORDER — GLYCOPYRROLATE 0.2 MG/ML IJ SOLN
0.2000 mg | INTRAMUSCULAR | Status: DC | PRN
  Administered 2020-01-23: 11:00:00 0.2 mg via INTRAVENOUS
  Filled 2020-01-23 (×2): qty 1

## 2020-01-23 MED ORDER — GLYCOPYRROLATE 1 MG PO TABS
1.0000 mg | ORAL_TABLET | ORAL | Status: DC | PRN
  Filled 2020-01-23: qty 1

## 2020-01-24 NOTE — Progress Notes (Addendum)
Family here and viewed patient. No questions and no valuables to give. Discharged to morgue

## 2020-01-27 LAB — CULTURE, BLOOD (ROUTINE X 2)
Culture: NO GROWTH
Culture: NO GROWTH
Special Requests: ADEQUATE
Special Requests: ADEQUATE

## 2020-02-18 NOTE — Progress Notes (Addendum)
Ch was paged to accompany family bedside after the death of their loved one. Upon my arrival patient was already deceased. The son and daughter were bedside grieving.  I stayed with family for an extended period for support and left as other staff began to instruct them of next steps. The family is Muslim instead of prayer I offered them words of comfort.

## 2020-02-18 NOTE — Death Summary Note (Addendum)
Death Summary  Chad Allen [id number] DOB: 09-27-1941 DOA: 2020-02-14  PCP: Alvester Morin, MD  Admit date: 14-Feb-2020 Date of Death: Feb 15, 2020 Time of Death: 13:08pm Notification: Alvester Morin, MD notified of death of 02/15/2020   History of present illness:  Chad Allen is a 79 y.o. male with a history of Chad Allen  is a 79 y.o. African-American male with a known history of hypertension, dyslipidemia, type 2 diabetes mellitus, trigeminal neuralgia and dementia status post G-tube placement, who is a resident of [facility] skilled nursing facility, who presented to the emergency room with acute onset of altered mental status with associated hypoxia with pulse oximetry that was down to the 80s and later up to 96% on her percent nonrebreather.  The patient had a vomiting episode that was immediately suctioned.  No other history could be obtained from the patient as he was mentally obtunded and nonverbal.  In the ER he was found hypotensive, requiring 6 L of oxygen nasal cannula only satting 75%, BUN of 32 creatinine 1.68 and lactic acidosis.  Procalcitonin was 0.47.  Covid and influenza was negative.  Chest x-ray showed mild right basal subsegmental atelectasis or infiltrate.  Family had decided to pursue comfort measures and palliative care was consulted for hospice bed. Patient was seen and examined this a.m. he was on a nonrebreather.  But at 1308 patient passed away. Final Diagnoses:  1.   Cardiopulmonary arrest 2.  Acute hypoxic respiratory failure likely secondary to aspiration pneumonia 3.Dementia    PE: Heent : pupils not reactive Heart sound: absent Lung sound: absent Abd: soft, nd, no bs Ext: no edema The results of significant diagnostics from this hospitalization (including imaging, microbiology, ancillary and laboratory) are listed below for reference.    Significant Diagnostic Studies: CT Head Wo Contrast  Result Date:  01/10/2020 CLINICAL DATA:  Right facial droop. Jaw pain. EXAM: CT HEAD WITHOUT CONTRAST TECHNIQUE: Contiguous axial images were obtained from the base of the skull through the vertex without intravenous contrast. COMPARISON:  Head CT 12/21/2019. Brain MRI 12/22/2019 FINDINGS: Brain: Stable generalized atrophy. Stable ventricular enlargement likely due to central atrophy. Cavum septum pellucidum. No intracranial hemorrhage, mass effect, or midline shift. The basilar cisterns are patent. Stable degree of chronic small vessel ischemia, moderate to extensive. No evidence of territorial infarct or acute ischemia. No extra-axial or intracranial fluid collection. Vascular: Mild skull base atherosclerosis. No hyperdense vessel. Skull: No fracture or focal lesion. Sinuses/Orbits: No acute finding. Other: None. IMPRESSION: 1. No acute intracranial abnormality. 2. Stable atrophy and chronic small vessel ischemia. Electronically Signed   By: Keith Rake M.D.   On: 01/10/2020 22:36   DG Chest Portable 1 View  Result Date: 14-Feb-2020 CLINICAL DATA:  Shortness of breath. EXAM: PORTABLE CHEST 1 VIEW COMPARISON:  December 22, 2019. FINDINGS: The heart size and mediastinal contours are within normal limits. No pneumothorax or pleural effusion is noted. Left lung is clear. Mild right basilar subsegmental atelectasis or infiltrate is noted. The visualized skeletal structures are unremarkable. IMPRESSION: Mild right basilar subsegmental atelectasis or infiltrate. Follow-up radiographs are recommended to ensure resolution. Electronically Signed   By: Marijo Conception M.D.   On: 02/14/20 18:47    Microbiology: Recent Results (from the past 240 hour(s))  Respiratory Panel by RT PCR (Flu A&B, Covid) - Nasopharyngeal Swab     Status: None   Collection Time: 14-Feb-2020  6:26 PM   Specimen: Nasopharyngeal Swab  Result Value Ref Range Status   SARS Coronavirus  2 by RT PCR NEGATIVE NEGATIVE Final    Comment: (NOTE) SARS-CoV-2  target nucleic acids are NOT DETECTED. The SARS-CoV-2 RNA is generally detectable in upper respiratoy specimens during the acute phase of infection. The lowest concentration of SARS-CoV-2 viral copies this assay can detect is 131 copies/mL. A negative result does not preclude SARS-Cov-2 infection and should not be used as the sole basis for treatment or other patient management decisions. A negative result may occur with  improper specimen collection/handling, submission of specimen other than nasopharyngeal swab, presence of viral mutation(s) within the areas targeted by this assay, and inadequate number of viral copies (<131 copies/mL). A negative result must be combined with clinical observations, patient history, and epidemiological information. The expected result is Negative. Fact Sheet for Patients:  PinkCheek.be Fact Sheet for Healthcare Providers:  GravelBags.it This test is not yet ap proved or cleared by the [place] FDA and  has been authorized for detection and/or diagnosis of SARS-CoV-2 by FDA under an Emergency Use Authorization (EUA). This EUA will remain  in effect (meaning this test can be used) for the duration of the COVID-19 declaration under Section 564(b)(1) of the Act, 21 U.S.C. section 360bbb-3(b)(1), unless the authorization is terminated or revoked sooner.    Influenza A by PCR NEGATIVE NEGATIVE Final   Influenza B by PCR NEGATIVE NEGATIVE Final    Comment: (NOTE) The Xpert Xpress SARS-CoV-2/FLU/RSV assay is intended as an aid in  the diagnosis of influenza from Nasopharyngeal swab specimens and  should not be used as a sole basis for treatment. Nasal washings and  aspirates are unacceptable for Xpert Xpress SARS-CoV-2/FLU/RSV  testing. Fact Sheet for Patients: PinkCheek.be Fact Sheet for Healthcare Providers: GravelBags.it This test is  not yet approved or cleared by the [place] FDA and  has been authorized for detection and/or diagnosis of SARS-CoV-2 by  FDA under an Emergency Use Authorization (EUA). This EUA will remain  in effect (meaning this test can be used) for the duration of the  Covid-19 declaration under Section 564(b)(1) of the Act, 21  U.S.C. section 360bbb-3(b)(1), unless the authorization is  terminated or revoked. Performed at [facility], [address] [place], [place] [address]   Blood culture (routine x 2)     Status: None (Preliminary result)   Collection Time: 02/07/2020  6:27 PM   Specimen: BLOOD  Result Value Ref Range Status   Specimen Description BLOOD BLOOD LEFT HAND  Final   Special Requests   Final    BOTTLES DRAWN AEROBIC AND ANAEROBIC Blood Culture adequate volume   Culture   Final    NO GROWTH < 12 HOURS Performed at [facility], [address], [place], [place] [address]    Report Status PENDING  Incomplete  Blood culture (routine x 2)     Status: None (Preliminary result)   Collection Time: 01/28/2020  6:27 PM   Specimen: BLOOD  Result Value Ref Range Status   Specimen Description BLOOD LEFT ANTECUBITAL  Final   Special Requests   Final    BOTTLES DRAWN AEROBIC AND ANAEROBIC Blood Culture adequate volume   Culture   Final    NO GROWTH < 12 HOURS Performed at [facility], [address] [place], [place] [address]    Report Status PENDING  Incomplete     Labs: Basic Metabolic Panel: Recent Labs  Lab 02/01/2020 1824  NA 134*  K 4.9  CL 99  CO2 24  GLUCOSE 215*  BUN  32*  CREATININE 1.68*  CALCIUM 9.5   Liver Function Tests: Recent Labs  Lab 02/06/2020 1824  AST 21  ALT 15  ALKPHOS 103  BILITOT 0.5  PROT 7.6  ALBUMIN 3.1*   Recent Labs  Lab 02/12/2020 1824  LIPASE 49   No results for input(s): AMMONIA in the last 168 hours. CBC: Recent Labs  Lab 01/25/2020 1824  WBC 15.1*  NEUTROABS 11.4*  HGB 10.6*  HCT 34.3*  MCV  90.3  PLT 551*   Cardiac Enzymes: No results for input(s): CKTOTAL, CKMB, CKMBINDEX, TROPONINI in the last 168 hours. D-Dimer No results for input(s): DDIMER in the last 72 hours. BNP: Invalid input(s): POCBNP CBG: Recent Labs  Lab 02/09/2020 1823  GLUCAP 211*   Anemia work up No results for input(s): VITAMINB12, FOLATE, FERRITIN, TIBC, IRON, RETICCTPCT in the last 72 hours. Urinalysis    Component Value Date/Time   COLORURINE YELLOW (A) 12/21/2019 0324   APPEARANCEUR CLOUDY (A) 12/21/2019 0324   LABSPEC 1.012 12/21/2019 0324   PHURINE 7.0 12/21/2019 0324   GLUCOSEU NEGATIVE 12/21/2019 0324   HGBUR NEGATIVE 12/21/2019 0324   BILIRUBINUR NEGATIVE 12/21/2019 0324   KETONESUR NEGATIVE 12/21/2019 0324   PROTEINUR 30 (A) 12/21/2019 0324   NITRITE NEGATIVE 12/21/2019 0324   LEUKOCYTESUR LARGE (A) 12/21/2019 0324   Sepsis Labs Invalid input(s): PROCALCITONIN,  WBC,  LACTICIDVEN  Time spent <29 min  SIGNED:  Nolberto Hanlon, MD  [facility] January 28, 2020, 2:18 PM Pager   If 7PM-7AM, please contact night-coverage [email or web address] Password [email or web address]

## 2020-02-18 NOTE — Plan of Care (Signed)
  Problem: Skin Integrity: Goal: Risk for impaired skin integrity will decrease Outcome: Completed/Met

## 2020-02-18 NOTE — Progress Notes (Signed)
Pt was not responding, not breathing. No vital signs noted. No Heart Rate noted but ausculation. Death Pronounce at 15 upon the verification of LPN and MD. Family at bedside.

## 2020-02-18 NOTE — Progress Notes (Signed)
Nutrition Brief Note  Patient identified to be seen for Malnutrition Screening Tool (MST). Chart reviewed. Patient has transitioned to comfort care.   No nutrition interventions warranted at this time. Please consult RD as needed.   Jacklynn Barnacle, MS, RD, LDN Office: [phone] Pager: [phone] After Hours/Weekend Pager: [phone]

## 2020-02-18 DEATH — deceased

## 2020-02-25 ENCOUNTER — Ambulatory Visit: Payer: Medicare Other | Admitting: Gastroenterology

## 2020-05-30 IMAGING — CT CT HEAD W/O CM
3 series · 15 of 47 positions shown, 18 images · non-contrast
Comparison: Head CT 12/21/2019. Brain MRI 12/22/2019

CLINICAL DATA: Right facial droop. Jaw pain.

EXAM:
CT HEAD WITHOUT CONTRAST
TECHNIQUE: Contiguous axial images were obtained from the base of the skull
through the vertex without intravenous contrast.

[Series 2: head wo · axial · 0.47mm/px · z∈[+271,+411]mm · 9 of 34 slices shown, 12 images]
[im 3/34  brain]
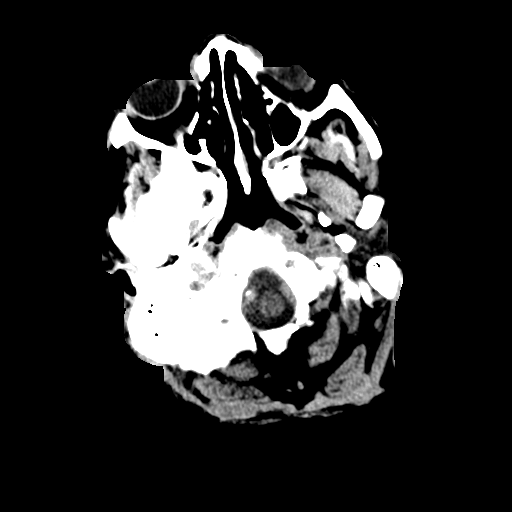
[im 3/34  bone]
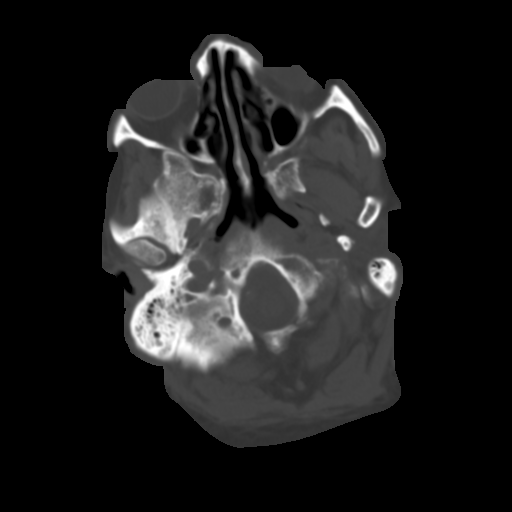
[im 6/34  brain]
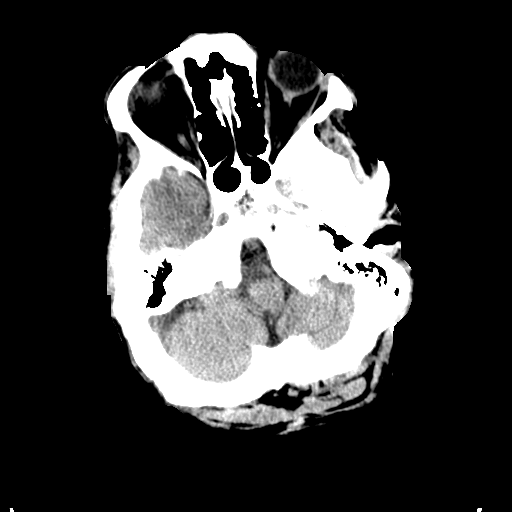
[im 10/34  brain]
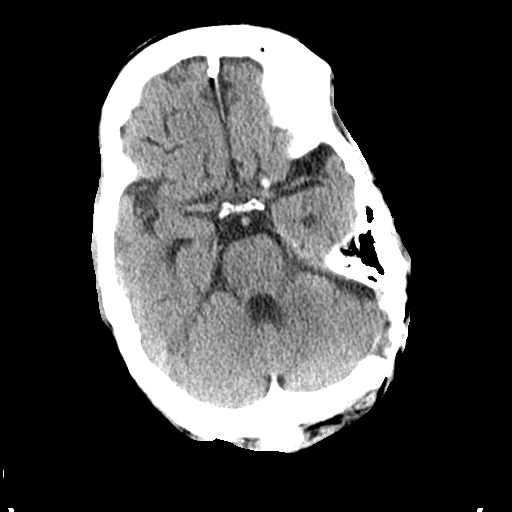
[im 13/34  brain]
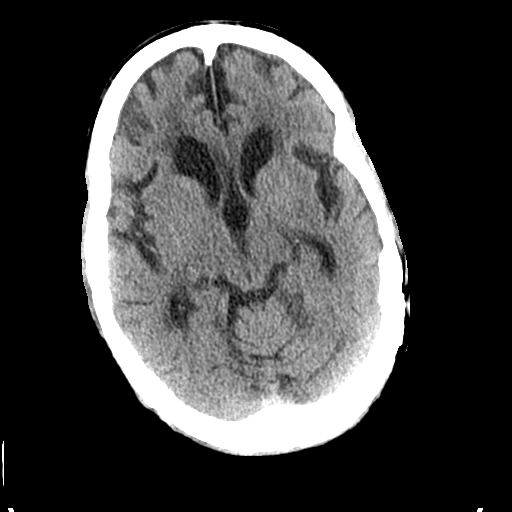
[im 18/34  brain]
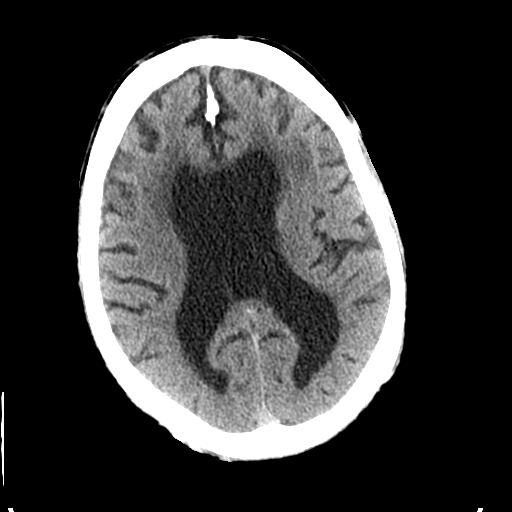
[im 18/34  bone]
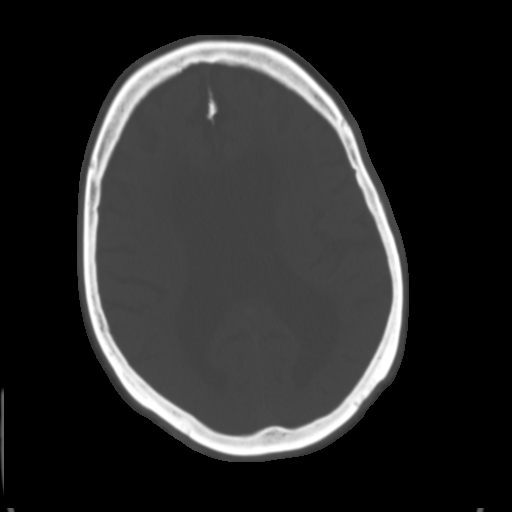
[im 21/34  brain]
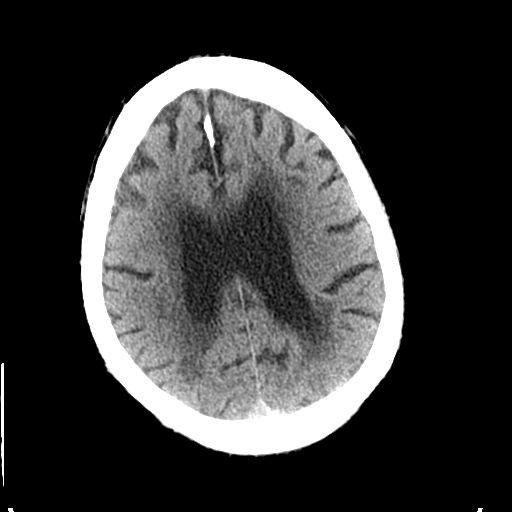
[im 24/34  brain]
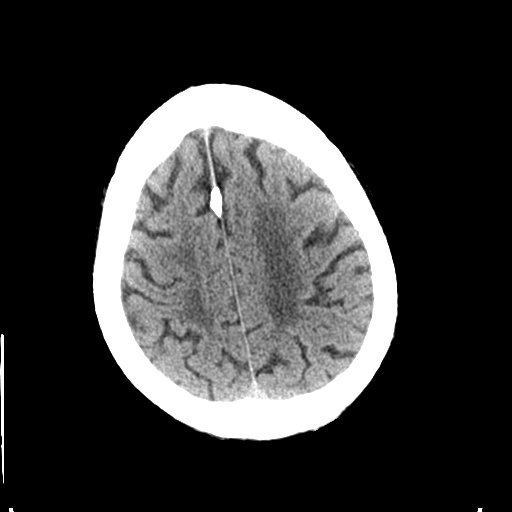
[im 28/34  brain]
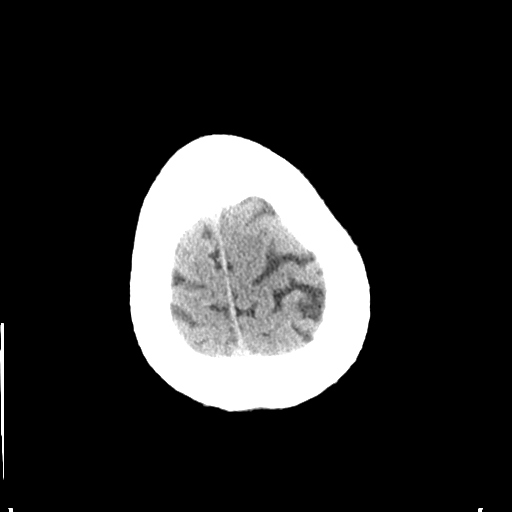
[im 31/34  brain]
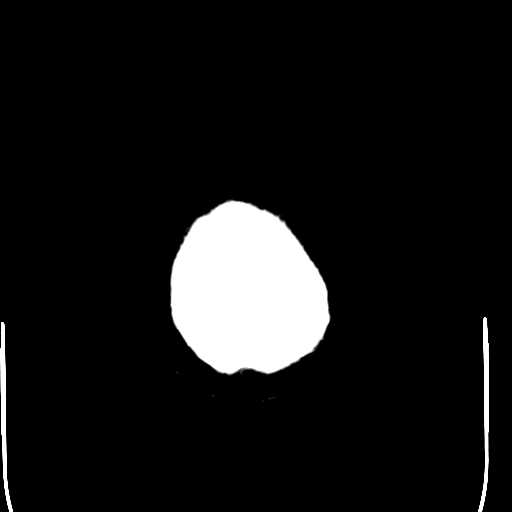
[im 31/34  bone]
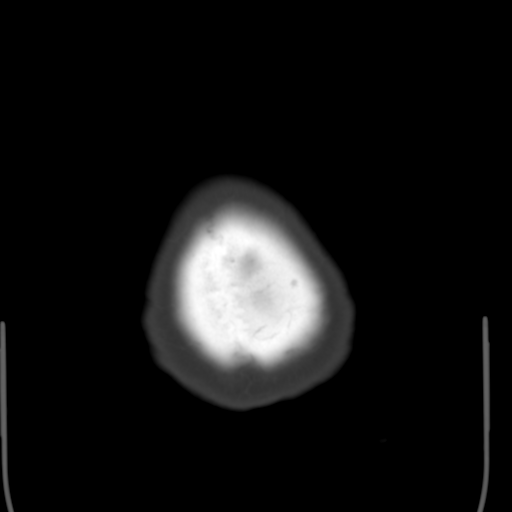

[Series 4: coronal soft tissue · coronal · 0.32mm/px · 3 of 71 slices shown]
[im 24/71  brain]
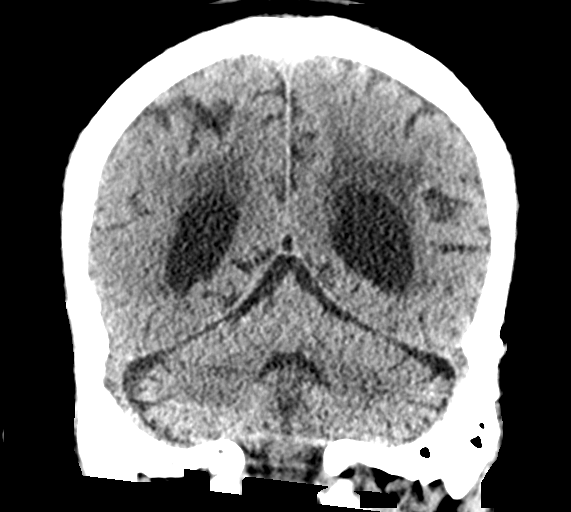
[im 32/71  brain]
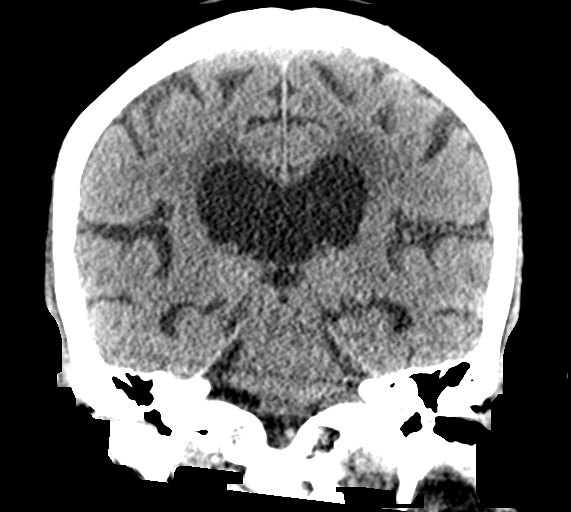
[im 39/71  brain]
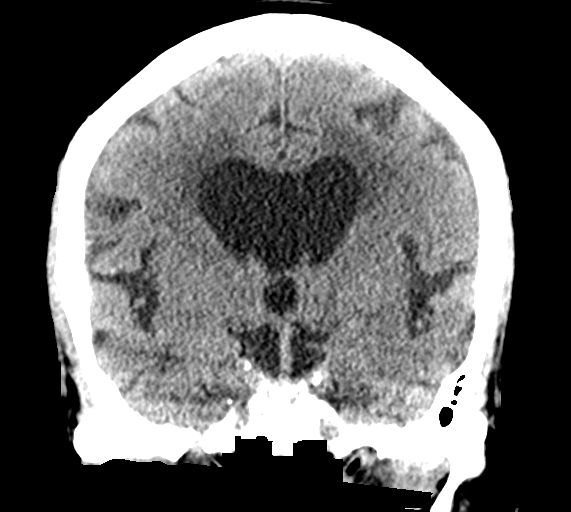

[Series 5: sagittal soft tissue · sagittal · 0.32mm/px · 3 of 56 slices shown]
[im 20/56  brain]
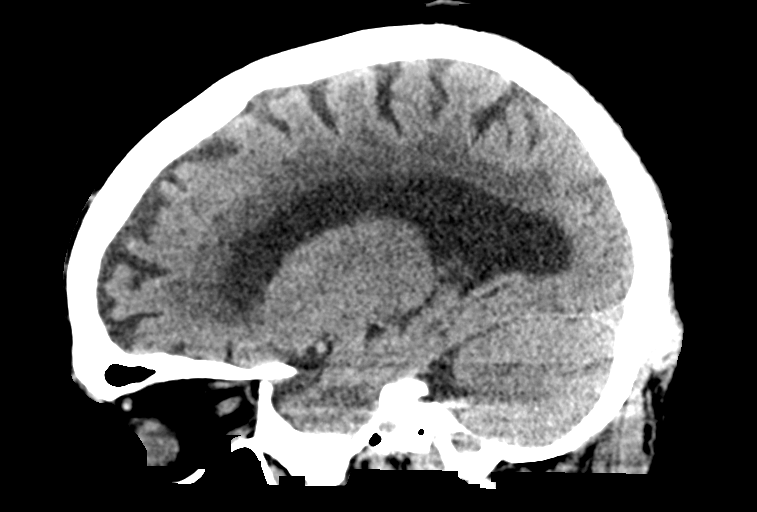
[im 28/56  brain]
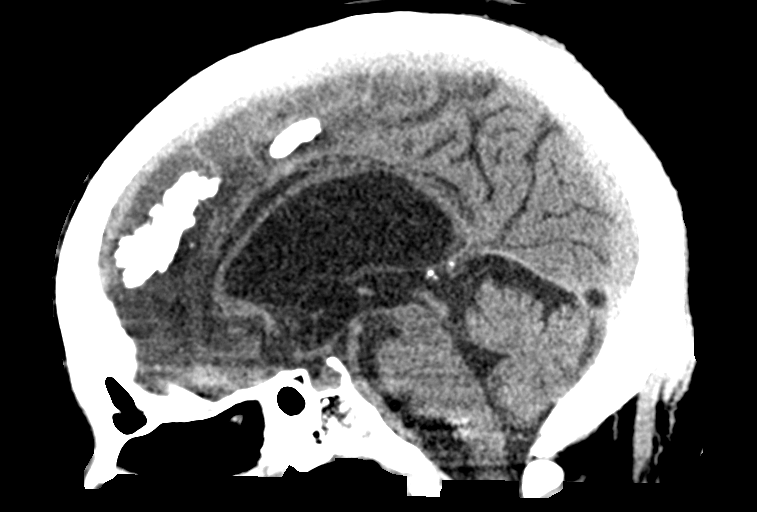
[im 36/56  brain]
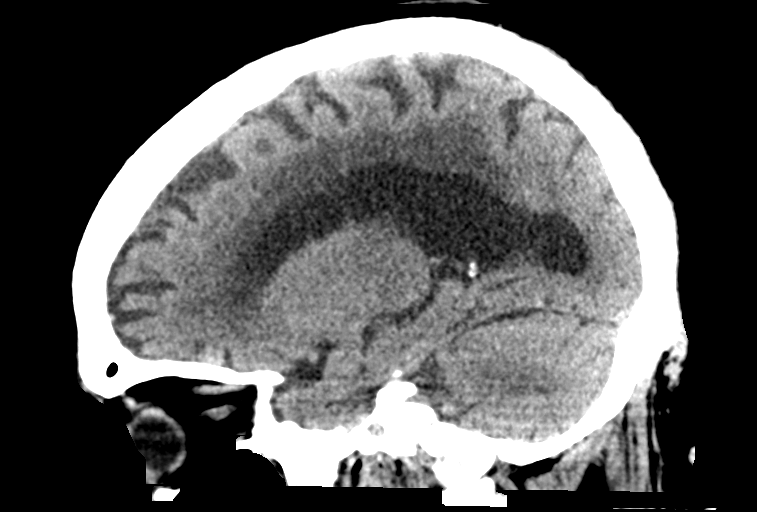

[15 of 47 positions shown; findings below may reference images not displayed]

FINDINGS: Brain: Stable generalized atrophy. Stable ventricular enlargement
likely due to central atrophy. Cavum septum pellucidum. No
intracranial hemorrhage, mass effect, or midline shift. The basilar
cisterns are patent. Stable degree of chronic small vessel ischemia,
moderate to extensive. No evidence of territorial infarct or acute
ischemia. No extra-axial or intracranial fluid collection.

Vascular: Mild skull base atherosclerosis. No hyperdense vessel.

Skull: No fracture or focal lesion.

Sinuses/Orbits: No acute finding.

Other: None.
IMPRESSION: 1. No acute intracranial abnormality.
2. Stable atrophy and chronic small vessel ischemia.
